# Patient Record
Sex: Male | Born: 1965
Health system: Southern US, Community
[De-identification: ages and names within clinical notes are randomized; demographics above are authoritative.]

## PROBLEM LIST (undated history)

## (undated) DIAGNOSIS — F419 Anxiety disorder, unspecified: Secondary | ICD-10-CM

## (undated) DIAGNOSIS — I10 Essential (primary) hypertension: Secondary | ICD-10-CM

## (undated) DIAGNOSIS — K219 Gastro-esophageal reflux disease without esophagitis: Secondary | ICD-10-CM

## (undated) DIAGNOSIS — R2231 Localized swelling, mass and lump, right upper limb: Secondary | ICD-10-CM

## (undated) HISTORY — PX: WISDOM TOOTH EXTRACTION: SHX21

---

## 2012-05-03 ENCOUNTER — Emergency Department (HOSPITAL_COMMUNITY): Payer: 59

## 2012-05-03 ENCOUNTER — Other Ambulatory Visit: Payer: Self-pay | Admitting: Family Medicine

## 2012-05-03 ENCOUNTER — Observation Stay (HOSPITAL_COMMUNITY)
Admission: EM | Admit: 2012-05-03 | Discharge: 2012-05-03 | Disposition: A | Discharge status: Home / Self Care | Payer: 59 | Attending: Emergency Medicine | Admitting: Emergency Medicine

## 2012-05-03 ENCOUNTER — Encounter (HOSPITAL_COMMUNITY): Payer: Self-pay

## 2012-05-03 ENCOUNTER — Observation Stay (HOSPITAL_COMMUNITY): Payer: 59

## 2012-05-03 DIAGNOSIS — I10 Essential (primary) hypertension: Secondary | ICD-10-CM

## 2012-05-03 DIAGNOSIS — G459 Transient cerebral ischemic attack, unspecified: Secondary | ICD-10-CM

## 2012-05-03 DIAGNOSIS — R110 Nausea: Secondary | ICD-10-CM | POA: Insufficient documentation

## 2012-05-03 DIAGNOSIS — E785 Hyperlipidemia, unspecified: Secondary | ICD-10-CM

## 2012-05-03 DIAGNOSIS — H538 Other visual disturbances: Secondary | ICD-10-CM

## 2012-05-03 DIAGNOSIS — Z79899 Other long term (current) drug therapy: Secondary | ICD-10-CM | POA: Insufficient documentation

## 2012-05-03 DIAGNOSIS — H532 Diplopia: Secondary | ICD-10-CM

## 2012-05-03 DIAGNOSIS — R42 Dizziness and giddiness: Secondary | ICD-10-CM | POA: Insufficient documentation

## 2012-05-03 HISTORY — DX: Essential (primary) hypertension: I10

## 2012-05-03 LAB — CBC
MCH: 29.7 pg (ref 26.0–34.0)
MCV: 81.7 fL (ref 78.0–100.0)
Platelets: 160 10*3/uL (ref 150–400)
RBC: 5.09 MIL/uL (ref 4.22–5.81)

## 2012-05-03 LAB — COMPREHENSIVE METABOLIC PANEL
ALT: 34 U/L (ref 0–53)
AST: 23 U/L (ref 0–37)
AST: 22 U/L (ref 0–37)
CO2: 23 mEq/L (ref 19–32)
CO2: 24 mEq/L (ref 19–32)
Calcium: 9.4 mg/dL (ref 8.4–10.5)
Calcium: 9 mg/dL (ref 8.4–10.5)
Chloride: 104 mEq/L (ref 96–112)
Creatinine, Ser: 1.05 mg/dL (ref 0.50–1.35)
GFR calc Af Amer: >90 mL/min (ref >90)
GFR calc Af Amer: >90 mL/min (ref >90)
GFR calc non Af Amer: 86 mL/min — ABNORMAL LOW (ref >90)
GFR calc non Af Amer: 85 mL/min — ABNORMAL LOW (ref >90)
Glucose, Bld: 99 mg/dL (ref 70–99)
Sodium: 140 mEq/L (ref 135–145)
Total Bilirubin: 0.9 mg/dL (ref 0.3–1.2)

## 2012-05-03 LAB — CBC WITH DIFFERENTIAL/PLATELET
Eosinophils Relative: 1 % (ref 0–5)
HCT: 42.9 % (ref 39.0–52.0)
Lymphocytes Relative: 30 % (ref 12–46)
Lymphs Abs: 1.7 10*3/uL (ref 0.7–4.0)
MCV: 80.8 fL (ref 78.0–100.0)
Monocytes Absolute: 0.3 10*3/uL (ref 0.1–1.0)
Neutro Abs: 3.6 10*3/uL (ref 1.7–7.7)
Platelets: 161 10*3/uL (ref 150–400)
RBC: 5.31 MIL/uL (ref 4.22–5.81)
WBC: 5.7 10*3/uL (ref 4.0–10.5)

## 2012-05-03 LAB — LIPID PANEL
HDL: 44 mg/dL (ref >39)
LDL Cholesterol: 144 mg/dL — ABNORMAL HIGH (ref 0–99)
Triglycerides: 102 mg/dL (ref <150)

## 2012-05-03 LAB — TROPONIN I: Troponin I: <0.3 ng/mL (ref <0.30)

## 2012-05-03 MED ORDER — SODIUM CHLORIDE 0.9 % IV BOLUS (SEPSIS)
1000.0000 mL | Freq: Once | INTRAVENOUS | Status: AC
  Administered 2012-05-03: 1000 mL via INTRAVENOUS

## 2012-05-03 MED ORDER — ASPIRIN 81 MG PO CHEW: 81.0000 mg | CHEWABLE_TABLET | Freq: Every day | ORAL | Status: DC

## 2012-05-03 MED ORDER — ASPIRIN 325 MG PO TABS
325.0000 mg | ORAL_TABLET | Freq: Every day | ORAL | Status: DC
  Administered 2012-05-03: 325 mg via ORAL
  Filled 2012-05-03: qty 1

## 2012-05-03 MED ORDER — ATORVASTATIN CALCIUM 10 MG PO TABS: 10.0000 mg | ORAL_TABLET | Freq: Every day | ORAL | Status: DC

## 2012-05-03 MED ORDER — ENOXAPARIN SODIUM 40 MG/0.4ML ~~LOC~~ SOLN
40.0000 mg | SUBCUTANEOUS | Status: DC
  Administered 2012-05-03: 40 mg via SUBCUTANEOUS
  Filled 2012-05-03: qty 0.4

## 2012-05-03 NOTE — Progress Notes (Signed)
VASCULAR LAB PRELIMINARY  PRELIMINARY  PRELIMINARY  PRELIMINARY  Carotid duplex completed.    Preliminary report:  Bilateral:  No evidence of hemodynamically significant internal carotid artery stenosis.   Vertebral artery flow is antegrade.     Karl Davidson, RVS 05/03/2012, 4:58 PM

## 2012-05-03 NOTE — ED Notes (Signed)
Patient transported to CT 

## 2012-05-03 NOTE — ED Notes (Signed)
Pt presents with sudden onset of blurred vision to both eyes with at work today at [employer].  Pt reports feeling tired and flushed during event, lasting 4-5 minutes.  Pt denies any pain.  Pt seen at his PCP after episode and referred here.  At present, pt reports his head feels "goofy" but reports vision is back to normal.

## 2012-05-03 NOTE — ED Provider Notes (Signed)
History     CSN: [id number]  Arrival date & time 05/03/12  1112   First MD Initiated Contact with Patient 05/03/12 1206      Chief Complaint  Patient presents with  . Blurred Vision    (Consider location/radiation/quality/duration/timing/severity/associated sxs/prior treatment) HPI Comments: Patient presents with episode of blurred vision in both eyes that lasted about 4 minutes and is now resolved. He was standing at work when this happened. With associated with nausea. He denies any dizziness but did have mild lightheadedness. No vertigo. States the vision appeared blurry as if he got up too fast he was already standing in the vision was 100 times worse than it is when he gets up too fast. He denies any chest pain or shortness of breath. He feels back to normal now. His vision is normal. Denies any focal weakness, numbness or tingling. No difficulty talking or swallowing.  The history is provided by the patient.    Past Medical History  Diagnosis Date  . Hypertension     History reviewed. No pertinent past surgical history.  No family history on file.  History  Substance Use Topics  . Smoking status: Never Smoker   . Smokeless tobacco: Not on file  . Alcohol Use: Yes      Review of Systems  Constitutional: Negative for fever, activity change and appetite change.  HENT: Negative for congestion and rhinorrhea.   Eyes: Positive for visual disturbance.  Respiratory: Negative for cough, chest tightness and shortness of breath.   Cardiovascular: Negative for chest pain.  Gastrointestinal: Negative for nausea, vomiting and abdominal pain.  Genitourinary: Negative for dysuria.  Neurological: Positive for dizziness. Negative for weakness, numbness and headaches.    Allergies  Review of patient's allergies indicates no known allergies.  Home Medications   Current Outpatient Rx  Name Route Sig Dispense Refill  . ESCITALOPRAM OXALATE 10 MG PO TABS Oral Take 10 mg by  mouth daily.      BP 144/92  Pulse 67  Temp 99.3 F (37.4 C) (Oral)  Resp 17  SpO2 99%  Physical Exam  Constitutional: He is oriented to person, place, and time. He appears well-developed and well-nourished. No distress.  HENT:  Head: Normocephalic and atraumatic.  Mouth/Throat: Oropharynx is clear and moist. No oropharyngeal exudate.  Eyes: Conjunctivae normal and EOM are normal. Pupils are equal, round, and reactive to light.  Neck: Normal range of motion. Neck supple.       No carotid bruit  Cardiovascular: Normal rate, regular rhythm and normal heart sounds.   No murmur heard. Pulmonary/Chest: Effort normal and breath sounds normal. No respiratory distress.  Abdominal: Soft. There is no tenderness. There is no rebound and no guarding.  Musculoskeletal: Normal range of motion. He exhibits no edema and no tenderness.  Neurological: He is alert and oriented to person, place, and time. No cranial nerve deficit.       CN 2-12 intact, 5/5 strength throughout, no ataxia on finger to nose, visual fields full to confrontation.  Skin: Skin is warm.    ED Course  Procedures (including critical care time)  Labs Reviewed  CBC WITH DIFFERENTIAL - Abnormal; Notable for the following:    MCHC 36.6 (*)     All other components within normal limits  COMPREHENSIVE METABOLIC PANEL - Abnormal; Notable for the following:    GFR calc non Af Amer 86 (*)     All other components within normal limits  PROTIME-INR  APTT  TROPONIN I  CBC  COMPREHENSIVE METABOLIC PANEL  URINALYSIS, ROUTINE W REFLEX MICROSCOPIC  LIPID PANEL  HEMOGLOBIN A1C   Ct Head Wo Contrast  05/03/2012  *RADIOLOGY REPORT*  Clinical Data: Blurred vision  CT HEAD WITHOUT CONTRAST  Technique:  Contiguous axial images were obtained from the base of the skull through the vertex without contrast.  Comparison: None.  Findings: No acute hemorrhage, acute infarction, or mass lesion is identified.  No midline shift.  No  ventriculomegaly.  No skull fracture.  Orbits and paranasal sinuses are intact.  IMPRESSION: Normal head CT.   Original Report Authenticated By: Harrel Lemon, M.D.      No diagnosis found.    MDM  Transient episode of blurred vision in both eyes. Now back to normal. No other neurological deficits. Not associated with vertigo or dizziness or lightheadedness. Do not happen on position change.  Concern for TIA. ABCD 2 score is 1. Patient is a tachycardia with standing. No hypotension. No further blurry vision, dizziness, lightheadedness. Given risk factors of smoking and hypertension we'll proceed with TIA workup. Continue IV hydration and oral hydration after passing swallow screen.   Date: 05/03/2012  Rate: 64  Rhythm: normal sinus rhythm  QRS Axis: normal  Intervals: normal  ST/T Wave abnormalities: nonspecific ST/T changes  Conduction Disutrbances:none  Narrative Interpretation: T wave inversion lead 3  Old EKG Reviewed: none available       Glynn Octave, MD 05/03/12 [phone]

## 2012-05-03 NOTE — Progress Notes (Signed)
  Echocardiogram 2D Echocardiogram has been performed.  Karl Davidson 05/03/2012, 5:02 PM

## 2012-05-03 NOTE — ED Notes (Signed)
Visual acuity was  20/20 OD. 20/20 OS 20/20 OU  Visual acuity was completed with corrective lenses due to pt states that his natural vision is "very bad".

## 2012-05-03 NOTE — ED Notes (Signed)
Prescriptions given with discharge instructions. Pt. Verbalized understanding. Alert and oriented x4.

## 2012-05-03 NOTE — ED Provider Notes (Signed)
Patient to move to CDU under observation for TIA protocol.  This is a shared visit with Dr Manus Gunning. Patient with hx HTN, smoking. Sudden onset bilateral blurry vision x 5 minutes.  Pt is also orthostatic.  Pt to come to CDU for IVF, TIA protocol.  ABCD2 score by my calculation is 1.  Discussed patient with Fayrene Helper, PA-C, who assumes care of patient upon arrival to CDU.     [place], [place] 05/03/12 [phone]

## 2012-05-03 NOTE — ED Provider Notes (Signed)
Medical screening examination/treatment/procedure(s) were conducted as a shared visit with non-physician practitioner(s) and myself.  I personally evaluated the patient during the encounter   Karl Octave, MD 05/03/12 1642

## 2012-05-03 NOTE — ED Provider Notes (Signed)
TIA protocol.  Risk factors include smoking, HTN.  Sudden onset of bilat blurry vision, and orthostatic.   Will give ASA. ABCD2 score of 1 (for BP of 159/115).    Patient reports he does not smoke.  Does have history of hypertension currently not on any medication, and a family history of stroke. His visual acuity is normal. He does have evidence of orthostasis as his heart rate fluctuating from 59 In a lying position, to 77 on standing position.  IVF given.  On examination he appeared in good health and spirits. Vital signs as documented. Skin warm and dry and without overt rashes. Neck without JVD. Lungs clear. Heart exam notable for regular rhythm, normal sounds and absence of murmurs, rubs or gallops. Abdomen unremarkable and without evidence of organomegaly, masses, or abdominal aortic enlargement. Extremities nonedematous. Neurologically, the patient was awake, alert, and oriented to person, place and time. There were no obvious focal neurologic abnormalities.  7:50 PM Brain MRI/MRA shows no acute abnormality.  Carotid duplex and 2d cardiac echo are unremarkable.  Pt does have a mildly elevated total cholesterol of 208 and LDL of 144.  Therefore, will prescribe a statin medication.  I also recommend pt to address his elevated BP with his PCP which he is scheduled to be seen on Monday.  Pt currently stable to be d/c.    BP 167/100  Pulse 57  Temp 98.6 F (37 C) (Oral)  Resp 20  Ht 5' 9.75" (1.772 m)  Wt 199 lb (90.266 kg)  BMI 28.76 kg/m2  SpO2 99%  Results for orders placed during the hospital encounter of 05/03/12  CBC WITH DIFFERENTIAL      Component Value Range   WBC 5.7  4.0 - 10.5 K/uL   RBC 5.31  4.22 - 5.81 MIL/uL   Hemoglobin 15.7  13.0 - 17.0 g/dL   HCT 56.4  33.2 - 95.1 %   MCV 80.8  78.0 - 100.0 fL   MCH 29.6  26.0 - 34.0 pg   MCHC 36.6 (*) 30.0 - 36.0 g/dL   RDW 88.4  16.6 - 06.3 %   Platelets 161  150 - 400 K/uL   Neutrophils Relative 63  43 - 77 %   Neutro Abs 3.6   1.7 - 7.7 K/uL   Lymphocytes Relative 30  12 - 46 %   Lymphs Abs 1.7  0.7 - 4.0 K/uL   Monocytes Relative 6  3 - 12 %   Monocytes Absolute 0.3  0.1 - 1.0 K/uL   Eosinophils Relative 1  0 - 5 %   Eosinophils Absolute 0.1  0.0 - 0.7 K/uL   Basophils Relative 0  0 - 1 %   Basophils Absolute 0.0  0.0 - 0.1 K/uL  COMPREHENSIVE METABOLIC PANEL      Component Value Range   Sodium 140  135 - 145 mEq/L   Potassium 3.5  3.5 - 5.1 mEq/L   Chloride 104  96 - 112 mEq/L   CO2 23  19 - 32 mEq/L   Glucose, Bld 99  70 - 99 mg/dL   BUN 12  6 - 23 mg/dL   Creatinine, Ser 0.16  0.50 - 1.35 mg/dL   Calcium 9.4  8.4 - 01.0 mg/dL   Total Protein 7.1  6.0 - 8.3 g/dL   Albumin 4.2  3.5 - 5.2 g/dL   AST 23  0 - 37 U/L   ALT 34  0 - 53 U/L   Alkaline Phosphatase  49  39 - 117 U/L   Total Bilirubin 0.9  0.3 - 1.2 mg/dL   GFR calc non Af Amer 86 (*) >90 mL/min   GFR calc Af Amer >90  >90 mL/min  PROTIME-INR      Component Value Range   Prothrombin Time 13.2  11.6 - 15.2 seconds   INR 1.01  0.00 - 1.49  APTT      Component Value Range   aPTT 29  24 - 37 seconds  TROPONIN I      Component Value Range   Troponin I <0.30  <0.30 ng/mL  CBC      Component Value Range   WBC 5.8  4.0 - 10.5 K/uL   RBC 5.09  4.22 - 5.81 MIL/uL   Hemoglobin 15.1  13.0 - 17.0 g/dL   HCT 16.1  09.6 - 04.5 %   MCV 81.7  78.0 - 100.0 fL   MCH 29.7  26.0 - 34.0 pg   MCHC 36.3 (*) 30.0 - 36.0 g/dL   RDW 40.9  81.1 - 91.4 %   Platelets 160  150 - 400 K/uL  COMPREHENSIVE METABOLIC PANEL      Component Value Range   Sodium 140  135 - 145 mEq/L   Potassium 4.1  3.5 - 5.1 mEq/L   Chloride 106  96 - 112 mEq/L   CO2 24  19 - 32 mEq/L   Glucose, Bld 86  70 - 99 mg/dL   BUN 11  6 - 23 mg/dL   Creatinine, Ser 7.82  0.50 - 1.35 mg/dL   Calcium 9.0  8.4 - 95.6 mg/dL   Total Protein 6.6  6.0 - 8.3 g/dL   Albumin 3.9  3.5 - 5.2 g/dL   AST 22  0 - 37 U/L   ALT 30  0 - 53 U/L   Alkaline Phosphatase 44  39 - 117 U/L   Total  Bilirubin 0.9  0.3 - 1.2 mg/dL   GFR calc non Af Amer 85 (*) >90 mL/min   GFR calc Af Amer >90  >90 mL/min  LIPID PANEL      Component Value Range   Cholesterol 208 (*) 0 - 200 mg/dL   Triglycerides 213  <086 mg/dL   HDL 44  >57 mg/dL   Total CHOL/HDL Ratio 4.7     VLDL 20  0 - 40 mg/dL   LDL Cholesterol 846 (*) 0 - 99 mg/dL   Ct Head Wo Contrast  05/03/2012  *RADIOLOGY REPORT*  Clinical Data: Blurred vision  CT HEAD WITHOUT CONTRAST  Technique:  Contiguous axial images were obtained from the base of the skull through the vertex without contrast.  Comparison: None.  Findings: No acute hemorrhage, acute infarction, or mass lesion is identified.  No midline shift.  No ventriculomegaly.  No skull fracture.  Orbits and paranasal sinuses are intact.  IMPRESSION: Normal head CT.   Original Report Authenticated By: Harrel Lemon, M.D.    Mr Brain Wo Contrast  05/03/2012  *RADIOLOGY REPORT*  Clinical Data:  Blurred vision.  Rule out CVA  MRI HEAD WITHOUT CONTRAST MRA HEAD WITHOUT CONTRAST  Technique:  Multiplanar, multiecho pulse sequences of the brain and surrounding structures were obtained without intravenous contrast. Angiographic images of the head were obtained using MRA technique without contrast.  Comparison:  CT head from today  MRI HEAD  Findings:  Negative for acute or chronic infarct.  Cerebral white matter is normal.  Negative for demyelinating disease.  Brainstem and cerebellum are normal.  Negative for hemorrhage or mass. Ventricle size is normal.  Pituitary is normal in size.  IMPRESSION: Normal exam  MRA HEAD  Findings: Both vertebral arteries are patent to the basilar.  The basilar is widely patent.  PICA, superior cerebellar, and posterior cerebral arteries are patent bilaterally.  Posterior communicating artery is patent bilaterally.  Internal carotid artery is patent bilaterally without stenosis. Anterior and middle cerebral arteries are patent bilaterally.  Negative for cerebral  aneurysm.  IMPRESSION: Negative   Original Report Authenticated By: Camelia Phenes, M.D.    Mr Mra Head/brain Wo Cm  05/03/2012  *RADIOLOGY REPORT*  Clinical Data:  Blurred vision.  Rule out CVA  MRI HEAD WITHOUT CONTRAST MRA HEAD WITHOUT CONTRAST  Technique:  Multiplanar, multiecho pulse sequences of the brain and surrounding structures were obtained without intravenous contrast. Angiographic images of the head were obtained using MRA technique without contrast.  Comparison:  CT head from today  MRI HEAD  Findings:  Negative for acute or chronic infarct.  Cerebral white matter is normal.  Negative for demyelinating disease.  Brainstem and cerebellum are normal.  Negative for hemorrhage or mass. Ventricle size is normal.  Pituitary is normal in size.  IMPRESSION: Normal exam  MRA HEAD  Findings: Both vertebral arteries are patent to the basilar.  The basilar is widely patent.  PICA, superior cerebellar, and posterior cerebral arteries are patent bilaterally.  Posterior communicating artery is patent bilaterally.  Internal carotid artery is patent bilaterally without stenosis. Anterior and middle cerebral arteries are patent bilaterally.  Negative for cerebral aneurysm.  IMPRESSION: Negative   Original Report Authenticated By: Camelia Phenes, M.D.         Author:  Kerrin Champagne  Service:  Vascular Lab  Author Type:  Cardiovascular Sonographer   Filed:  05/03/12 1658  Note Time:  05/03/12 1657          VASCULAR LAB  PRELIMINARY PRELIMINARY PRELIMINARY PRELIMINARY  Carotid duplex completed.  Preliminary report: Bilateral: No evidence of hemodynamically significant internal carotid artery stenosis. Vertebral artery flow is antegrade.  SLAUGHTER, [place], RVS  05/03/2012, 4:58 PM     Fayrene Helper, PA-C 05/03/12 2000

## 2012-05-03 NOTE — ED Notes (Signed)
Pt. Denies any blurred vision.

## 2012-05-03 NOTE — ED Notes (Signed)
Pt. is in Vascular Ultra sound and then will go to MRI.  Report given pt has not arrived to CDU

## 2012-05-04 NOTE — ED Provider Notes (Signed)
Medical screening examination/treatment/procedure(s) were conducted as a shared visit with non-physician practitioner(s) and myself.  I personally evaluated the patient during the encounter   Zohan Karlyn Glasco, MD 05/04/12 0904 

## 2013-04-03 ENCOUNTER — Ambulatory Visit
Admission: RE | Admit: 2013-04-03 | Discharge: 2013-04-03 | Disposition: A | Discharge status: Home / Self Care | Payer: BC Managed Care – PPO | Source: Ambulatory Visit | Attending: Dermatology | Admitting: Dermatology

## 2013-04-03 ENCOUNTER — Other Ambulatory Visit: Payer: Self-pay | Admitting: Dermatology

## 2013-04-03 DIAGNOSIS — D485 Neoplasm of uncertain behavior of skin: Secondary | ICD-10-CM

## 2013-04-11 ENCOUNTER — Telehealth (INDEPENDENT_AMBULATORY_CARE_PROVIDER_SITE_OTHER): Payer: Self-pay | Admitting: Surgery

## 2013-04-11 ENCOUNTER — Ambulatory Visit (INDEPENDENT_AMBULATORY_CARE_PROVIDER_SITE_OTHER): Payer: BC Managed Care – PPO | Admitting: Surgery

## 2013-04-11 ENCOUNTER — Encounter (INDEPENDENT_AMBULATORY_CARE_PROVIDER_SITE_OTHER): Payer: Self-pay | Admitting: Surgery

## 2013-04-11 VITALS — BP 138/80 | HR 60 | Temp 98.0°F | Resp 14 | Ht 69.0 in | Wt 200.0 lb

## 2013-04-11 DIAGNOSIS — R229 Localized swelling, mass and lump, unspecified: Secondary | ICD-10-CM

## 2013-04-11 DIAGNOSIS — R2231 Localized swelling, mass and lump, right upper limb: Secondary | ICD-10-CM | POA: Insufficient documentation

## 2013-04-11 NOTE — Progress Notes (Signed)
Re:   Karl Davidson DOB:   [birth year] MRN:   [id number]  ASSESSMENT AND PLAN: 1.  Knot, right forearm - 1.0 cm  I reviewed the findings.  This can be taken out under local anesthesia.  The ultrasound implies a "hypervascular" lesion.  I discussed the risks including, but not limited to, bleeding, infection, continued pain after the nodule is removed, and nerve injury.  I gave him literature on a lipoma, though I doubt it is a lipoma.  Will schedule this at a time convenient for her. 2.  Hypertension 3.  Anxiety  Chief Complaint  Patient presents with  . New Evaluation    eval lipoma/mass right upper forarm   REFERRING PHYSICIAN: Neldon Labella, MD  HISTORY OF PRESENT ILLNESS: Karl Davidson is a 47 y.o. (DOB: Jan 11, 1966)  white  male whose primary care physician is Neldon Labella, MD (he did see Dr. Teena Irani, but he has [facility]) and comes to me today for mass of the right arm.  He has noticed this knot for about 2 years.  It is a little better, but it has been tender from when he first noticed it.  He saw Dr. Adline Mango with dermatology.  She obtained an [place] on 04/03/2013 which showed a 8 x 8 mm hypervascular nodular lesion.  The patient is ready to have this removed.   Past Medical History  Diagnosis Date  . Hypertension      History reviewed. No pertinent past surgical history.    Current Outpatient Prescriptions  Medication Sig Dispense Refill  . amLODipine (NORVASC) 5 MG tablet Take 5 mg by mouth daily.      Marland Kitchen escitalopram (LEXAPRO) 10 MG tablet Take 10 mg by mouth daily.       No current facility-administered medications for this visit.     No Known Allergies  REVIEW OF SYSTEMS: Skin:  No history of rash.  No history of abnormal moles. Infection:  No history of hepatitis or HIV.  No history of MRSA. Neurologic:  Questionable TIA - 2013.  He said that his vision "fluttered" for about 15 minutes.  He spent a day in the ER, but no specific diagnosis  was found. Cardiac:  Hypertension. No history of heart disease.  No history of prior cardiac catheterization.  No history of seeing a cardiologist. Pulmonary:  Does not smoke cigarettes.  No asthma or bronchitis.  No OSA/CPAP.  Endocrine:  No diabetes. No thyroid disease. Gastrointestinal:  No history of stomach disease.  No history of liver disease.  No history of gall bladder disease.  No history of pancreas disease.  No history of colon disease. Urologic:  No history of kidney stones.  No history of bladder infections. Musculoskeletal:  No history of joint or back disease. Hematologic:  No bleeding disorder.  No history of anemia.  Not anticoagulated. Psycho-social:  The patient is oriented.   History of anxiety.  SOCIAL and FAMILY HISTORY: Unmarried, but has girlfriend. No children. Works for [employer].  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  PHYSICAL EXAM: BP 138/80  Pulse 60  Temp(Src) 98 F (36.7 C) (Temporal)  Resp 14  Ht 5\' 9"  (1.753 m)  Wt 200 lb (90.719 kg)  BMI 29.52 kg/m2  General: WN wM who is alert and generally healthy appearing.  HEENT: Normal. Pupils equal. Extremities:  Good strength and ROM  in upper and lower extremities.  1.0 cm nodule upper outer right forearm.  Not attached to the skin.  Is tender.  Does not feel like a lipoma. Neurologic:  Grossly intact to motor and sensory function. Psychiatric: Has normal mood and affect. Behavior is normal.   DATA REVIEWED: Epic notes  Ovidio Kin, MD,  [facility] [address] [place]  [address]   [place], [place]    [address] Phone:  [phone] FAX:  [phone]

## 2013-04-11 NOTE — Telephone Encounter (Signed)
Met with surgery scheduling, went over financial  responsibility, placed in pending file

## 2014-08-01 IMAGING — US US EXTREM UP *R* LTD
1 series · 13 of 13 positions shown · non-contrast
Comparison: None.

CLINICAL DATA: Nodular lesion in right forearm for 2 years,
increasing in size

EXAM:
RIGHT UPPER EXTREMITY SOFT TISSUE ULTRASOUND LIMITED
TECHNIQUE: Ultrasound examination was performed including evaluation of the
muscles, tendons, joint, and adjacent soft tissues.

[Series 1: us extrem up *right* ltd · 0.10mm/px · 13 of 13 slices shown]
[im 1/13]
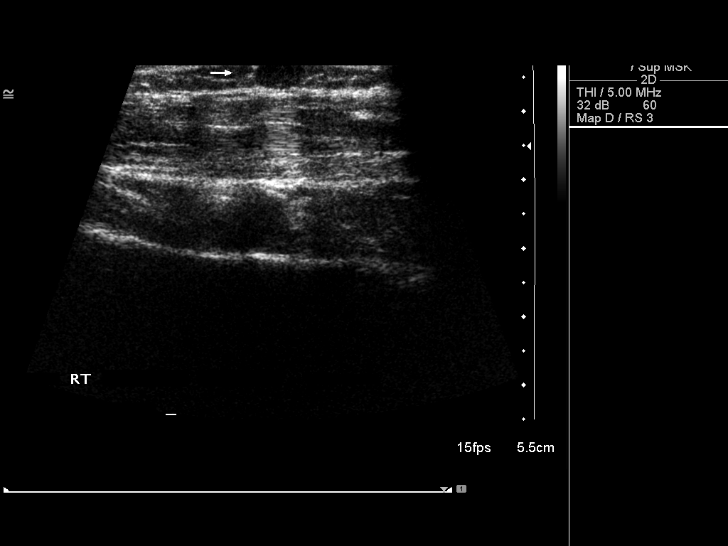
[im 2/13]
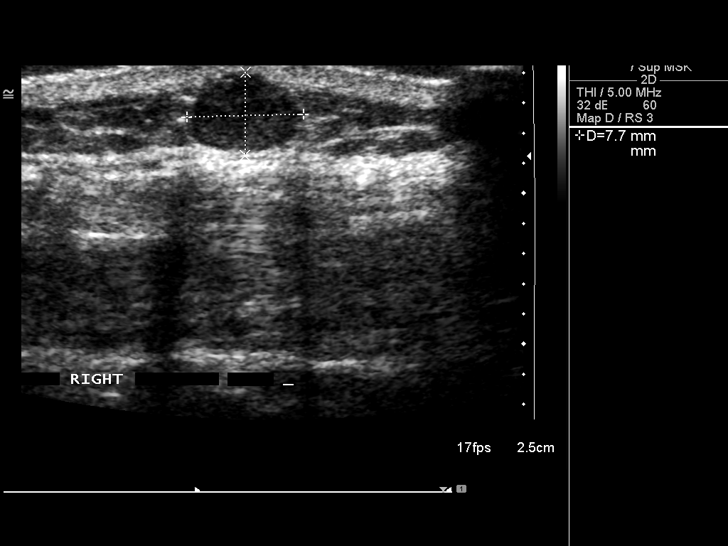
[im 3/13]
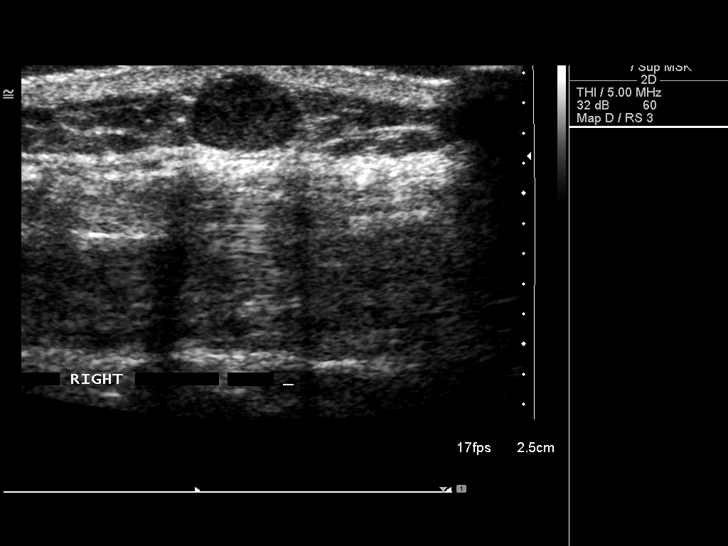
[im 4/13]
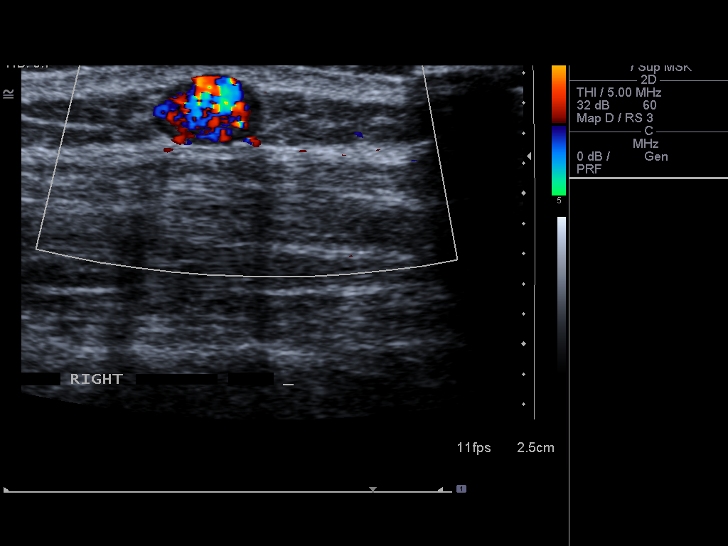
[im 5/13]
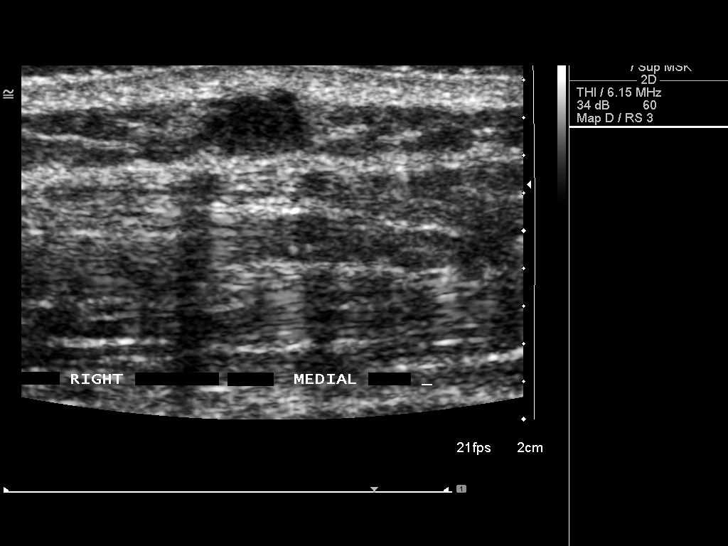
[im 6/13]
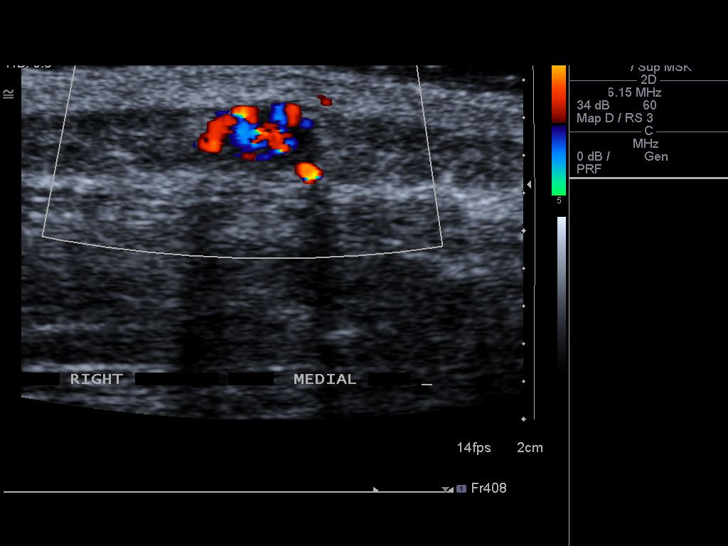
[im 7/13]
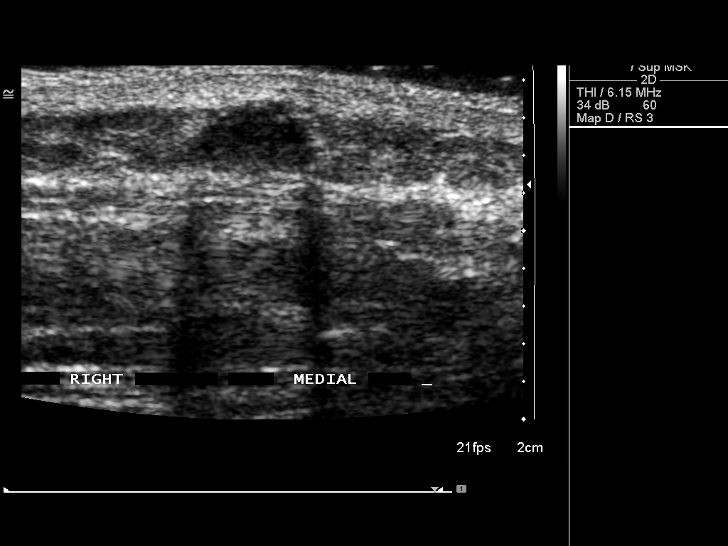
[im 8/13]
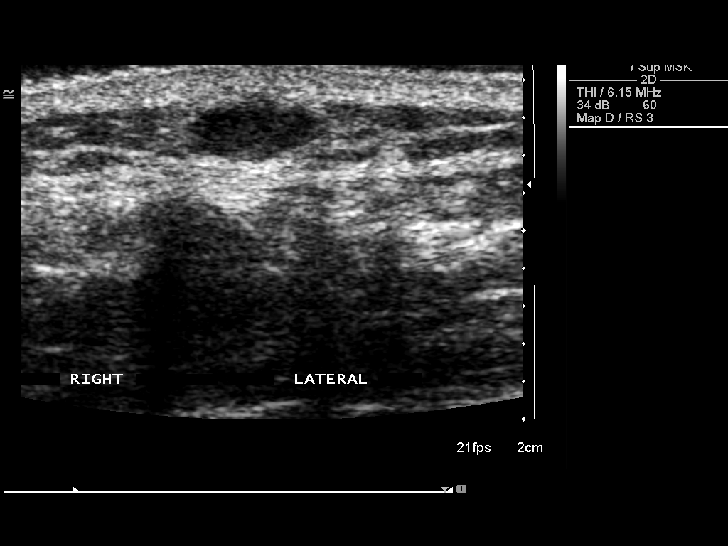
[im 9/13]
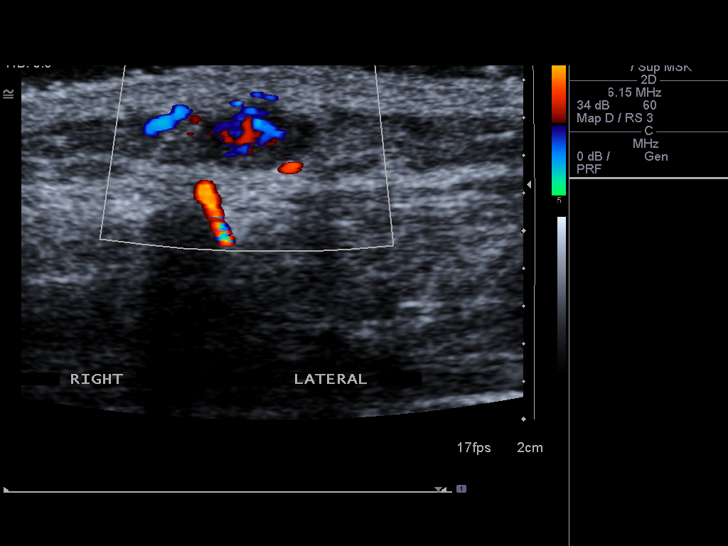
[im 10/13]
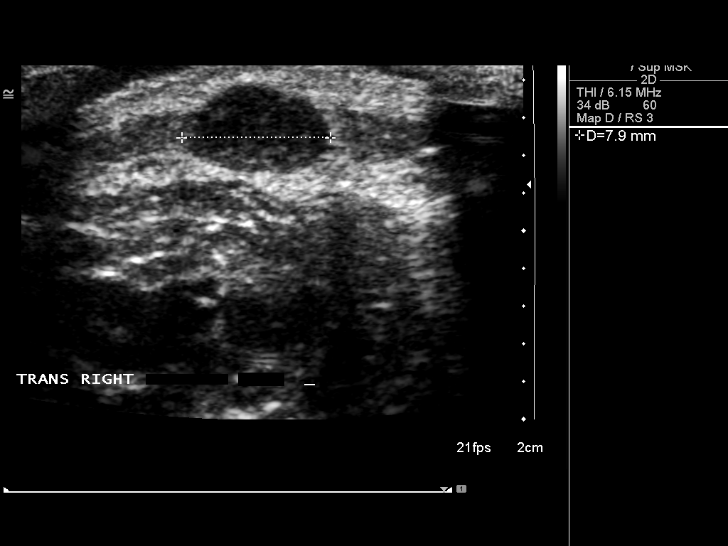
[im 11/13]
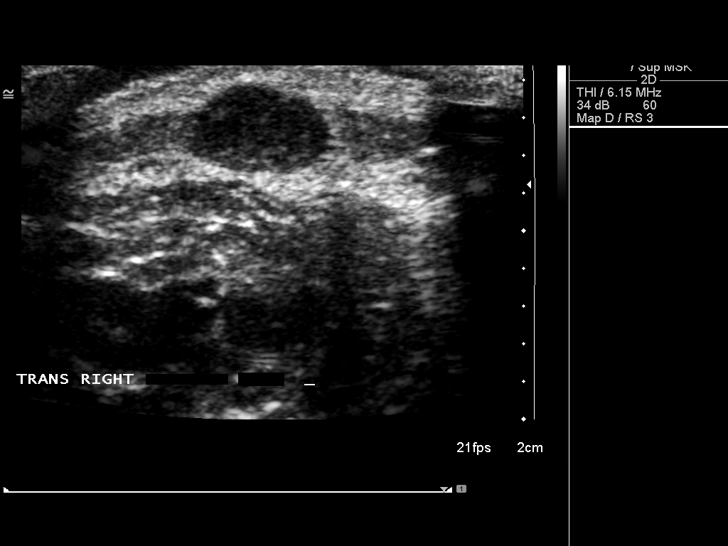
[im 12/13]
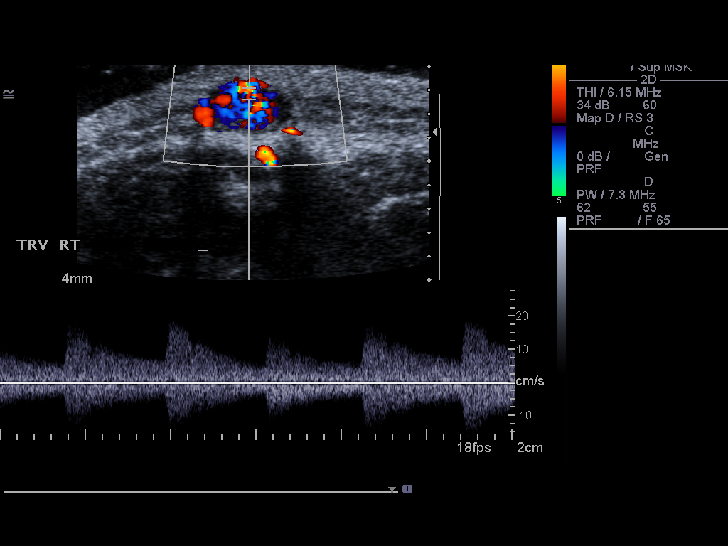
[im 13/13]
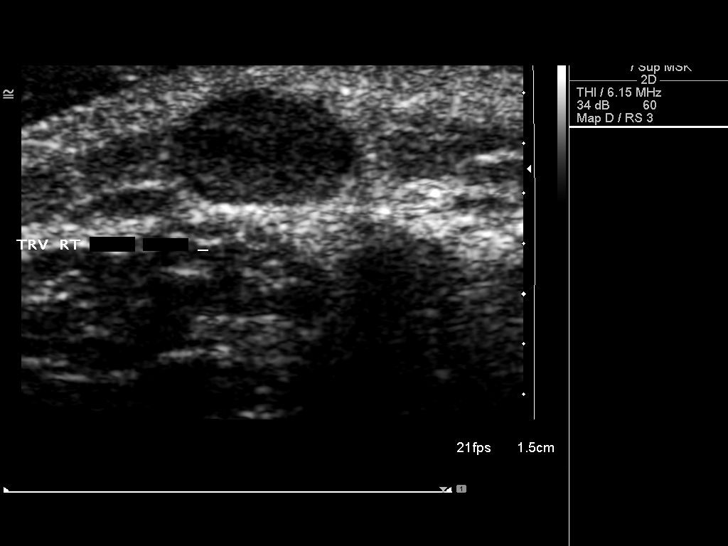

[13 of 13 positions shown; findings below may reference images not displayed]

FINDINGS: Ultrasound over the area in question was performed. There is a
superficial oval soft tissue mass present within the subcutaneous
soft tissues measuring 8 x 6 x 8 mm. This lesion does appear to be
hypervascular, and a hemangioma is a consideration. Continued
followup or MRI is recommended to assess further.
IMPRESSION: Hypervascular nodular superficial lesion in the right forearm may
represent a hemangioma of 8 mm in diameter, but consider either
close followup or MRI to assess further.

## 2015-12-22 DIAGNOSIS — E7800 Pure hypercholesterolemia, unspecified: Secondary | ICD-10-CM | POA: Diagnosis not present

## 2015-12-22 DIAGNOSIS — N529 Male erectile dysfunction, unspecified: Secondary | ICD-10-CM | POA: Diagnosis not present

## 2015-12-22 DIAGNOSIS — I10 Essential (primary) hypertension: Secondary | ICD-10-CM | POA: Diagnosis not present

## 2015-12-22 DIAGNOSIS — F411 Generalized anxiety disorder: Secondary | ICD-10-CM | POA: Diagnosis not present

## 2017-07-16 DIAGNOSIS — E669 Obesity, unspecified: Secondary | ICD-10-CM | POA: Diagnosis not present

## 2017-07-16 DIAGNOSIS — F411 Generalized anxiety disorder: Secondary | ICD-10-CM | POA: Diagnosis not present

## 2017-07-16 DIAGNOSIS — D1721 Benign lipomatous neoplasm of skin and subcutaneous tissue of right arm: Secondary | ICD-10-CM | POA: Diagnosis not present

## 2017-07-16 DIAGNOSIS — I10 Essential (primary) hypertension: Secondary | ICD-10-CM | POA: Diagnosis not present

## 2017-07-30 ENCOUNTER — Ambulatory Visit: Payer: Self-pay | Admitting: Surgery

## 2017-07-30 DIAGNOSIS — R2231 Localized swelling, mass and lump, right upper limb: Secondary | ICD-10-CM | POA: Diagnosis not present

## 2017-07-30 NOTE — H&P (Signed)
History of Present Illness Karl Davidson. Cumi Sanagustin MD; 07/30/2017 12:15 PM) The patient is a 52 year old male who presents with a complaint of Mass. Referred by Dr. Kathyrn Lass for right forearm Mass. This is a 52 year old male who presents with a 5 year history of a palpable mass in the lateral right forearm. He was evaluated by Dr. Alphonsa Overall in 2014. At that time this mass measured less than 1 cm. This was confirmed on ultrasound. The patient decided not to have this area removed. He has become noticeably larger and is causing severe tenderness. The patient has trouble making through the entire day at work because of the pain in his forearm. When this area is very tender there is some pain shooting down towards his fourth and fifth fingers. He denies any infections area. There has not been any further imaging of this area. 2014 he had an ultrasound of this area  CLINICAL DATA: Nodular lesion in right forearm for 2 years, increasing in size  EXAM: RIGHT UPPER EXTREMITY SOFT TISSUE ULTRASOUND LIMITED  TECHNIQUE: Ultrasound examination was performed including evaluation of the muscles, tendons, joint, and adjacent soft tissues.  COMPARISON: None.  FINDINGS: Ultrasound over the area in question was performed. There is a superficial oval soft tissue mass present within the subcutaneous soft tissues measuring 8 x 6 x 8 mm. This lesion does appear to be hypervascular, and a hemangioma is a consideration. Continued followup or MRI is recommended to assess further.  IMPRESSION: Hypervascular nodular superficial lesion in the right forearm may represent a hemangioma of 8 mm in diameter, but consider either close followup or MRI to assess further.   Electronically Signed By: Ivar Drape M.D. On: 04/03/2013 13:11   Past Surgical History (Tanisha A. Owens Shark, [place]; 07/30/2017 10:51 AM) Oral Surgery  Diagnostic Studies History (Tanisha A. Owens Shark, [place]; 07/30/2017 10:51 AM) Colonoscopy  never  Allergies (Tanisha A. Owens Shark, [place]; 07/30/2017 10:52 AM) No Known Drug Allergies [07/30/2017]: Allergies Reconciled  Medication History (Tanisha A. Owens Shark, [place]; 07/30/2017 10:53 AM) Lexapro (5MG  Tablet, Oral) Active. Losartan Potassium (50MG  Tablet, Oral) Active. AmLODIPine Besylate (10MG  Tablet, Oral) Active. Medications Reconciled  Social History (Tanisha A. Owens Shark, RMA; 07/30/2017 10:51 AM) Alcohol use Moderate alcohol use. Caffeine use Carbonated beverages, Tea. Illicit drug use Remotely quit drug use. Tobacco use Never smoker.  Family History (Tanisha A. Owens Shark, [place]; 07/30/2017 10:51 AM) Heart Disease Mother. Hypertension Mother.  Other Problems (Tanisha A. Owens Shark, [place]; 07/30/2017 10:51 AM) Anxiety Disorder High blood pressure Kidney Stone     Review of Systems (Tanisha A. Brown RMA; 07/30/2017 10:51 AM) General Not Present- Appetite Loss, Chills, Fatigue, Fever, Night Sweats, Weight Gain and Weight Loss. Skin Not Present- Change in Wart/Mole, Dryness, Hives, Jaundice, New Lesions, Non-Healing Wounds, Rash and Ulcer. HEENT Present- Wears glasses/contact lenses. Not Present- Earache, Hearing Loss, Hoarseness, Nose Bleed, Oral Ulcers, Ringing in the Ears, Seasonal Allergies, Sinus Pain, Sore Throat, Visual Disturbances and Yellow Eyes. Respiratory Not Present- Bloody sputum, Chronic Cough, Difficulty Breathing, Snoring and Wheezing. Breast Not Present- Breast Mass, Breast Pain, Nipple Discharge and Skin Changes. Cardiovascular Present- Swelling of Extremities. Not Present- Chest Pain, Difficulty Breathing Lying Down, Leg Cramps, Palpitations, Rapid Heart Rate and Shortness of Breath. Gastrointestinal Not Present- Abdominal Pain, Bloating, Bloody Stool, Change in Bowel Habits, Chronic diarrhea, Constipation, Difficulty Swallowing, Excessive gas, Gets full quickly at meals, Hemorrhoids, Indigestion, Nausea, Rectal Pain and Vomiting. Male Genitourinary Not Present-  Blood in Urine, Change in Urinary Stream, Frequency, Impotence, Nocturia, Painful Urination,  Urgency and Urine Leakage. Musculoskeletal Present- Muscle Pain. Not Present- Back Pain, Joint Pain, Joint Stiffness, Muscle Weakness and Swelling of Extremities. Neurological Present- Numbness, Tingling and Weakness. Not Present- Decreased Memory, Fainting, Headaches, Seizures, Tremor and Trouble walking. Psychiatric Present- Anxiety. Not Present- Bipolar, Change in Sleep Pattern, Depression, Fearful and Frequent crying. Endocrine Not Present- Cold Intolerance, Excessive Hunger, Hair Changes, Heat Intolerance, Hot flashes and New Diabetes. Hematology Not Present- Blood Thinners, Easy Bruising, Excessive bleeding, Gland problems, HIV and Persistent Infections.  Vitals (Tanisha A. Brown RMA; 07/30/2017 10:52 AM) 07/30/2017 10:52 AM Weight: 218 lb Height: 69in Body Surface Area: 2.14 m Body Mass Index: 32.19 kg/m  Temp.: 98.70F  Pulse: 63 (Regular)  BP: 126/82 (Sitting, Left Arm, Standard)      Physical Exam Rodman Key K. Towanda Hornstein MD; 07/30/2017 12:16 PM)  The physical exam findings are as follows: Note:WDWN in NAD Right lateral forearm - 2.2 cm mass - firm, tender, slight purplish color, not mobile; no sign of infection; Does not appear to sit on top of the ulnar nerve No numbness or tingling of 4th or 5th fingers with compression of this area.    Assessment & Plan Karl Davidson. Karl Karge MD; 07/30/2017 12:16 PM)  FOREARM MASS, RIGHT (R22.31) Impression: Possible hemangioma - possibly subfascial  2 cm, firm, tender - radiation down towards fingers  Current Plans Schedule for Surgery - Excision of mass - right forearm. The surgical procedure has been discussed with the patient. Potential risks, benefits, alternative treatments, and expected outcomes have been explained. All of the patient's questions at this time have been answered. The likelihood of reaching the patient's treatment  goal is good. The patient understand the proposed surgical procedure and wishes to proceed.  Karl Davidson. Karl Dover, MD, [facility]  General/ Trauma Surgery  07/30/2017 12:16 PM

## 2017-07-30 NOTE — H&P (View-Only) (Signed)
History of Present Illness Karl Davidson. Karl Kasa MD; 07/30/2017 12:15 PM) The patient is a 52 year old male who presents with a complaint of Mass. Referred by Dr. Kathyrn Lass for right forearm Mass. This is a 52 year old male who presents with a 5 year history of a palpable mass in the lateral right forearm. He was evaluated by Dr. Alphonsa Overall in 2014. At that time this mass measured less than 1 cm. This was confirmed on ultrasound. The patient decided not to have this area removed. He has become noticeably larger and is causing severe tenderness. The patient has trouble making through the entire day at work because of the pain in his forearm. When this area is very tender there is some pain shooting down towards his fourth and fifth fingers. He denies any infections area. There has not been any further imaging of this area. 2014 he had an ultrasound of this area  CLINICAL DATA: Nodular lesion in right forearm for 2 years, increasing in size  EXAM: RIGHT UPPER EXTREMITY SOFT TISSUE ULTRASOUND LIMITED  TECHNIQUE: Ultrasound examination was performed including evaluation of the muscles, tendons, joint, and adjacent soft tissues.  COMPARISON: None.  FINDINGS: Ultrasound over the area in question was performed. There is a superficial oval soft tissue mass present within the subcutaneous soft tissues measuring 8 x 6 x 8 mm. This lesion does appear to be hypervascular, and a hemangioma is a consideration. Continued followup or MRI is recommended to assess further.  IMPRESSION: Hypervascular nodular superficial lesion in the right forearm may represent a hemangioma of 8 mm in diameter, but consider either close followup or MRI to assess further.   Electronically Signed By: Ivar Drape M.D. On: 04/03/2013 13:11   Past Surgical History (Karl A. Owens Shark, [place]; 07/30/2017 10:51 AM) Oral Surgery  Diagnostic Studies History (Karl A. Owens Shark, [place]; 07/30/2017 10:51 AM) Colonoscopy  never  Allergies (Karl A. Owens Shark, [place]; 07/30/2017 10:52 AM) No Known Drug Allergies [07/30/2017]: Allergies Reconciled  Medication History (Karl A. Owens Shark, [place]; 07/30/2017 10:53 AM) Lexapro (5MG  Tablet, Oral) Active. Losartan Potassium (50MG  Tablet, Oral) Active. AmLODIPine Besylate (10MG  Tablet, Oral) Active. Medications Reconciled  Social History (Karl A. Owens Shark, RMA; 07/30/2017 10:51 AM) Alcohol use Moderate alcohol use. Caffeine use Carbonated beverages, Tea. Illicit drug use Remotely quit drug use. Tobacco use Never smoker.  Family History (Karl A. Owens Shark, [place]; 07/30/2017 10:51 AM) Heart Disease Mother. Hypertension Mother.  Other Problems (Karl A. Owens Shark, [place]; 07/30/2017 10:51 AM) Anxiety Disorder High blood pressure Kidney Stone     Review of Systems (Karl Davidson RMA; 07/30/2017 10:51 AM) General Not Present- Appetite Loss, Chills, Fatigue, Fever, Night Sweats, Weight Gain and Weight Loss. Skin Not Present- Change in Wart/Mole, Dryness, Hives, Jaundice, New Lesions, Non-Healing Wounds, Rash and Ulcer. HEENT Present- Wears glasses/contact lenses. Not Present- Earache, Hearing Loss, Hoarseness, Nose Bleed, Oral Ulcers, Ringing in the Ears, Seasonal Allergies, Sinus Pain, Sore Throat, Visual Disturbances and Yellow Eyes. Respiratory Not Present- Bloody sputum, Chronic Cough, Difficulty Breathing, Snoring and Wheezing. Breast Not Present- Breast Mass, Breast Pain, Nipple Discharge and Skin Changes. Cardiovascular Present- Swelling of Extremities. Not Present- Chest Pain, Difficulty Breathing Lying Down, Leg Cramps, Palpitations, Rapid Heart Rate and Shortness of Breath. Gastrointestinal Not Present- Abdominal Pain, Bloating, Bloody Stool, Change in Bowel Habits, Chronic diarrhea, Constipation, Difficulty Swallowing, Excessive gas, Gets full quickly at meals, Hemorrhoids, Indigestion, Nausea, Rectal Pain and Vomiting. Male Genitourinary Not Present-  Blood in Urine, Change in Urinary Stream, Frequency, Impotence, Nocturia, Painful Urination,  Urgency and Urine Leakage. Musculoskeletal Present- Muscle Pain. Not Present- Back Pain, Joint Pain, Joint Stiffness, Muscle Weakness and Swelling of Extremities. Neurological Present- Numbness, Tingling and Weakness. Not Present- Decreased Memory, Fainting, Headaches, Seizures, Tremor and Trouble walking. Psychiatric Present- Anxiety. Not Present- Bipolar, Change in Sleep Pattern, Depression, Fearful and Frequent crying. Endocrine Not Present- Cold Intolerance, Excessive Hunger, Hair Changes, Heat Intolerance, Hot flashes and New Diabetes. Hematology Not Present- Blood Thinners, Easy Bruising, Excessive bleeding, Gland problems, HIV and Persistent Infections.  Vitals (Karl Davidson RMA; 07/30/2017 10:52 AM) 07/30/2017 10:52 AM Weight: 218 lb Height: 69in Body Surface Area: 2.14 m Body Mass Index: 32.19 kg/m  Temp.: 98.23F  Pulse: 63 (Regular)  BP: 126/82 (Sitting, Left Arm, Standard)      Physical Exam Karl Key K. Robbyn Hodkinson MD; 07/30/2017 12:16 PM)  The physical exam findings are as follows: Note:WDWN in NAD Right lateral forearm - 2.2 cm mass - firm, tender, slight purplish color, not mobile; no sign of infection; Does not appear to sit on top of the ulnar nerve No numbness or tingling of 4th or 5th fingers with compression of this area.    Assessment & Plan Karl Davidson. Karl Advani MD; 07/30/2017 12:16 PM)  FOREARM MASS, RIGHT (R22.31) Impression: Possible hemangioma - possibly subfascial  2 cm, firm, tender - radiation down towards fingers  Current Plans Schedule for Surgery - Excision of mass - right forearm. The surgical procedure has been discussed with the patient. Potential risks, benefits, alternative treatments, and expected outcomes have been explained. All of the patient's questions at this time have been answered. The likelihood of reaching the patient's treatment  goal is good. The patient understand the proposed surgical procedure and wishes to proceed.  Karl Davidson. Karl Dover, MD, [facility]  General/ Trauma Surgery  07/30/2017 12:16 PM

## 2017-08-15 ENCOUNTER — Encounter (HOSPITAL_BASED_OUTPATIENT_CLINIC_OR_DEPARTMENT_OTHER): Payer: Self-pay | Admitting: *Deleted

## 2017-08-15 ENCOUNTER — Other Ambulatory Visit: Payer: Self-pay

## 2017-08-17 DIAGNOSIS — I10 Essential (primary) hypertension: Secondary | ICD-10-CM | POA: Diagnosis not present

## 2017-08-17 DIAGNOSIS — F411 Generalized anxiety disorder: Secondary | ICD-10-CM | POA: Diagnosis not present

## 2017-08-17 DIAGNOSIS — E669 Obesity, unspecified: Secondary | ICD-10-CM | POA: Diagnosis not present

## 2017-08-20 ENCOUNTER — Encounter (HOSPITAL_BASED_OUTPATIENT_CLINIC_OR_DEPARTMENT_OTHER)
Admission: RE | Admit: 2017-08-20 | Discharge: 2017-08-20 | Disposition: A | Discharge status: Home / Self Care | Payer: BLUE CROSS/BLUE SHIELD | Source: Ambulatory Visit | Attending: Surgery | Admitting: Surgery

## 2017-08-20 DIAGNOSIS — R2231 Localized swelling, mass and lump, right upper limb: Secondary | ICD-10-CM | POA: Diagnosis present

## 2017-08-20 DIAGNOSIS — F419 Anxiety disorder, unspecified: Secondary | ICD-10-CM | POA: Diagnosis not present

## 2017-08-20 DIAGNOSIS — Z8249 Family history of ischemic heart disease and other diseases of the circulatory system: Secondary | ICD-10-CM | POA: Diagnosis not present

## 2017-08-20 DIAGNOSIS — N200 Calculus of kidney: Secondary | ICD-10-CM | POA: Diagnosis not present

## 2017-08-20 DIAGNOSIS — Z79899 Other long term (current) drug therapy: Secondary | ICD-10-CM | POA: Diagnosis not present

## 2017-08-20 DIAGNOSIS — I10 Essential (primary) hypertension: Secondary | ICD-10-CM | POA: Diagnosis not present

## 2017-08-20 DIAGNOSIS — D1801 Hemangioma of skin and subcutaneous tissue: Secondary | ICD-10-CM | POA: Diagnosis not present

## 2017-08-22 ENCOUNTER — Ambulatory Visit (HOSPITAL_BASED_OUTPATIENT_CLINIC_OR_DEPARTMENT_OTHER)
Admission: RE | Admit: 2017-08-22 | Discharge: 2017-08-22 | Disposition: A | Discharge status: Home / Self Care | Payer: BLUE CROSS/BLUE SHIELD | Source: Ambulatory Visit | Attending: Surgery | Admitting: Surgery

## 2017-08-22 ENCOUNTER — Ambulatory Visit (HOSPITAL_BASED_OUTPATIENT_CLINIC_OR_DEPARTMENT_OTHER): Payer: BLUE CROSS/BLUE SHIELD | Admitting: Anesthesiology

## 2017-08-22 ENCOUNTER — Encounter (HOSPITAL_BASED_OUTPATIENT_CLINIC_OR_DEPARTMENT_OTHER)
Admission: RE | Disposition: A | Discharge status: Home / Self Care | Payer: Self-pay | Source: Ambulatory Visit | Attending: Surgery

## 2017-08-22 ENCOUNTER — Other Ambulatory Visit: Payer: Self-pay

## 2017-08-22 ENCOUNTER — Encounter (HOSPITAL_BASED_OUTPATIENT_CLINIC_OR_DEPARTMENT_OTHER): Payer: Self-pay | Admitting: Anesthesiology

## 2017-08-22 DIAGNOSIS — F419 Anxiety disorder, unspecified: Secondary | ICD-10-CM | POA: Insufficient documentation

## 2017-08-22 DIAGNOSIS — N200 Calculus of kidney: Secondary | ICD-10-CM | POA: Diagnosis not present

## 2017-08-22 DIAGNOSIS — Z79899 Other long term (current) drug therapy: Secondary | ICD-10-CM | POA: Diagnosis not present

## 2017-08-22 DIAGNOSIS — I10 Essential (primary) hypertension: Secondary | ICD-10-CM | POA: Insufficient documentation

## 2017-08-22 DIAGNOSIS — R2231 Localized swelling, mass and lump, right upper limb: Secondary | ICD-10-CM | POA: Diagnosis not present

## 2017-08-22 DIAGNOSIS — Z8249 Family history of ischemic heart disease and other diseases of the circulatory system: Secondary | ICD-10-CM | POA: Diagnosis not present

## 2017-08-22 DIAGNOSIS — D1801 Hemangioma of skin and subcutaneous tissue: Secondary | ICD-10-CM | POA: Diagnosis not present

## 2017-08-22 DIAGNOSIS — D1809 Hemangioma of other sites: Secondary | ICD-10-CM | POA: Diagnosis not present

## 2017-08-22 HISTORY — PX: MASS EXCISION: SHX2000

## 2017-08-22 HISTORY — DX: Localized swelling, mass and lump, right upper limb: R22.31

## 2017-08-22 HISTORY — DX: Gastro-esophageal reflux disease without esophagitis: K21.9

## 2017-08-22 HISTORY — DX: Anxiety disorder, unspecified: F41.9

## 2017-08-22 SURGERY — EXCISION MASS
Anesthesia: General | Site: Arm Lower | Laterality: Right

## 2017-08-22 MED ORDER — SCOPOLAMINE 1 MG/3DAYS TD PT72: 1.0000 | MEDICATED_PATCH | Freq: Once | TRANSDERMAL | Status: DC | PRN

## 2017-08-22 MED ORDER — BUPIVACAINE-EPINEPHRINE (PF) 0.25% -1:200000 IJ SOLN
INTRAMUSCULAR | Status: AC
  Filled 2017-08-22: qty 30

## 2017-08-22 MED ORDER — LIDOCAINE HCL (CARDIAC) 20 MG/ML IV SOLN
INTRAVENOUS | Status: DC | PRN
  Administered 2017-08-22: 30 mg via INTRAVENOUS

## 2017-08-22 MED ORDER — CHLORHEXIDINE GLUCONATE CLOTH 2 % EX PADS: 6.0000 | MEDICATED_PAD | Freq: Once | CUTANEOUS | Status: DC

## 2017-08-22 MED ORDER — ONDANSETRON HCL 4 MG/2ML IJ SOLN
INTRAMUSCULAR | Status: AC
  Filled 2017-08-22: qty 2

## 2017-08-22 MED ORDER — FENTANYL CITRATE (PF) 100 MCG/2ML IJ SOLN
INTRAMUSCULAR | Status: AC
  Filled 2017-08-22: qty 2

## 2017-08-22 MED ORDER — BUPIVACAINE-EPINEPHRINE 0.25% -1:200000 IJ SOLN
INTRAMUSCULAR | Status: DC | PRN
  Administered 2017-08-22: 5 mL

## 2017-08-22 MED ORDER — LIDOCAINE 2% (20 MG/ML) 5 ML SYRINGE
INTRAMUSCULAR | Status: AC
  Filled 2017-08-22: qty 5

## 2017-08-22 MED ORDER — FENTANYL CITRATE (PF) 100 MCG/2ML IJ SOLN: 25.0000 ug | INTRAMUSCULAR | Status: DC | PRN

## 2017-08-22 MED ORDER — PROPOFOL 10 MG/ML IV BOLUS
INTRAVENOUS | Status: DC | PRN
  Administered 2017-08-22: 200 mg via INTRAVENOUS

## 2017-08-22 MED ORDER — PROMETHAZINE HCL 25 MG/ML IJ SOLN: 6.2500 mg | INTRAMUSCULAR | Status: DC | PRN

## 2017-08-22 MED ORDER — MIDAZOLAM HCL 2 MG/2ML IJ SOLN
INTRAMUSCULAR | Status: AC
  Filled 2017-08-22: qty 2

## 2017-08-22 MED ORDER — GLYCOPYRROLATE 0.2 MG/ML IJ SOLN
INTRAMUSCULAR | Status: DC | PRN
  Administered 2017-08-22: 0.1 mg via INTRAVENOUS

## 2017-08-22 MED ORDER — MEPERIDINE HCL 25 MG/ML IJ SOLN: 6.2500 mg | INTRAMUSCULAR | Status: DC | PRN

## 2017-08-22 MED ORDER — FENTANYL CITRATE (PF) 100 MCG/2ML IJ SOLN: 50.0000 ug | INTRAMUSCULAR | Status: DC | PRN

## 2017-08-22 MED ORDER — MIDAZOLAM HCL 2 MG/2ML IJ SOLN: 1.0000 mg | INTRAMUSCULAR | Status: DC | PRN

## 2017-08-22 MED ORDER — DEXAMETHASONE SODIUM PHOSPHATE 10 MG/ML IJ SOLN
INTRAMUSCULAR | Status: AC
  Filled 2017-08-22: qty 1

## 2017-08-22 MED ORDER — FENTANYL CITRATE (PF) 100 MCG/2ML IJ SOLN
INTRAMUSCULAR | Status: DC | PRN
  Administered 2017-08-22: 100 ug via INTRAVENOUS

## 2017-08-22 MED ORDER — KETOROLAC TROMETHAMINE 30 MG/ML IJ SOLN: 30.0000 mg | Freq: Once | INTRAMUSCULAR | Status: DC | PRN

## 2017-08-22 MED ORDER — LACTATED RINGERS IV SOLN
INTRAVENOUS | Status: DC
  Administered 2017-08-22 (×2): via INTRAVENOUS

## 2017-08-22 MED ORDER — ONDANSETRON HCL 4 MG/2ML IJ SOLN
INTRAMUSCULAR | Status: DC | PRN
  Administered 2017-08-22: 4 mg via INTRAVENOUS

## 2017-08-22 MED ORDER — MIDAZOLAM HCL 5 MG/5ML IJ SOLN
INTRAMUSCULAR | Status: DC | PRN
  Administered 2017-08-22: 2 mg via INTRAVENOUS

## 2017-08-22 MED ORDER — DEXAMETHASONE SODIUM PHOSPHATE 4 MG/ML IJ SOLN
INTRAMUSCULAR | Status: DC | PRN
  Administered 2017-08-22: 10 mg via INTRAVENOUS

## 2017-08-22 MED ORDER — OXYCODONE HCL 5 MG/5ML PO SOLN: 5.0000 mg | Freq: Once | ORAL | Status: DC | PRN

## 2017-08-22 MED ORDER — CEFAZOLIN SODIUM-DEXTROSE 2-4 GM/100ML-% IV SOLN
2.0000 g | INTRAVENOUS | Status: AC
  Administered 2017-08-22: 2 g via INTRAVENOUS

## 2017-08-22 MED ORDER — OXYCODONE HCL 5 MG PO TABS: 5.0000 mg | ORAL_TABLET | Freq: Once | ORAL | Status: DC | PRN

## 2017-08-22 MED ORDER — CEFAZOLIN SODIUM-DEXTROSE 2-4 GM/100ML-% IV SOLN
INTRAVENOUS | Status: AC
  Filled 2017-08-22: qty 100

## 2017-08-22 SURGICAL SUPPLY — 45 items
APL SKNCLS STERI-STRIP NONHPOA (GAUZE/BANDAGES/DRESSINGS) ×1
BENZOIN TINCTURE PRP APPL 2/3 (GAUZE/BANDAGES/DRESSINGS) ×2 IMPLANT
BLADE CLIPPER SURG (BLADE) ×2 IMPLANT
BLADE SURG 15 STRL LF DISP TIS (BLADE) ×1 IMPLANT
BLADE SURG 15 STRL SS (BLADE) ×2
CANISTER SUCT 1200ML W/VALVE (MISCELLANEOUS) IMPLANT
CHLORAPREP W/TINT 26ML (MISCELLANEOUS) ×2 IMPLANT
COVER BACK TABLE 60X90IN (DRAPES) ×2 IMPLANT
COVER MAYO STAND STRL (DRAPES) ×2 IMPLANT
DECANTER SPIKE VIAL GLASS SM (MISCELLANEOUS) IMPLANT
DRAPE LAPAROTOMY 100X72 PEDS (DRAPES) ×2 IMPLANT
DRAPE UTILITY XL STRL (DRAPES) ×1 IMPLANT
DRSG TEGADERM 2-3/8X2-3/4 SM (GAUZE/BANDAGES/DRESSINGS) ×1 IMPLANT
DRSG TEGADERM 4X4.75 (GAUZE/BANDAGES/DRESSINGS) IMPLANT
ELECT COATED BLADE 2.86 ST (ELECTRODE) ×2 IMPLANT
ELECT REM PT RETURN 9FT ADLT (ELECTROSURGICAL) ×2
ELECTRODE REM PT RTRN 9FT ADLT (ELECTROSURGICAL) ×1 IMPLANT
GAUZE SPONGE 4X4 12PLY STRL LF (GAUZE/BANDAGES/DRESSINGS) IMPLANT
GLOVE BIO SURGEON STRL SZ 6.5 (GLOVE) ×2 IMPLANT
GLOVE BIO SURGEON STRL SZ7 (GLOVE) ×2 IMPLANT
GLOVE BIOGEL PI IND STRL 7.0 (GLOVE) IMPLANT
GLOVE BIOGEL PI IND STRL 7.5 (GLOVE) ×1 IMPLANT
GLOVE BIOGEL PI INDICATOR 7.0 (GLOVE) ×2
GLOVE BIOGEL PI INDICATOR 7.5 (GLOVE) ×1
GOWN STRL REUS W/ TWL LRG LVL3 (GOWN DISPOSABLE) ×2 IMPLANT
GOWN STRL REUS W/TWL LRG LVL3 (GOWN DISPOSABLE) ×4
NDL HYPO 25X1 1.5 SAFETY (NEEDLE) ×1 IMPLANT
NEEDLE HYPO 25X1 1.5 SAFETY (NEEDLE) ×2 IMPLANT
NS IRRIG 1000ML POUR BTL (IV SOLUTION) IMPLANT
PACK BASIN DAY SURGERY FS (CUSTOM PROCEDURE TRAY) ×2 IMPLANT
PENCIL BUTTON HOLSTER BLD 10FT (ELECTRODE) ×2 IMPLANT
SLEEVE SCD COMPRESS KNEE MED (MISCELLANEOUS) ×2 IMPLANT
SPONGE GAUZE 2X2 8PLY STRL LF (GAUZE/BANDAGES/DRESSINGS) IMPLANT
STRIP CLOSURE SKIN 1/2X4 (GAUZE/BANDAGES/DRESSINGS) ×2 IMPLANT
SUT MON AB 4-0 PC3 18 (SUTURE) IMPLANT
SUT PROLENE 6 0 P 1 18 (SUTURE) IMPLANT
SUT SILK 2 0 PERMA HAND 18 BK (SUTURE) IMPLANT
SUT VIC AB 3-0 SH 27 (SUTURE) ×2
SUT VIC AB 3-0 SH 27X BRD (SUTURE) ×1 IMPLANT
SYR BULB 3OZ (MISCELLANEOUS) ×2 IMPLANT
SYR CONTROL 10ML LL (SYRINGE) ×2 IMPLANT
TOWEL OR 17X24 6PK STRL BLUE (TOWEL DISPOSABLE) ×2 IMPLANT
TOWEL OR NON WOVEN STRL DISP B (DISPOSABLE) ×2 IMPLANT
TUBE CONNECTING 20X1/4 (TUBING) IMPLANT
YANKAUER SUCT BULB TIP NO VENT (SUCTIONS) IMPLANT

## 2017-08-22 NOTE — Anesthesia Postprocedure Evaluation (Signed)
Anesthesia Post Note  Patient: Karl Davidson  Procedure(s) Performed: EXCISION MASS RIGHT FOREARM (Right Arm Lower)     Patient location during evaluation: PACU Anesthesia Type: General Level of consciousness: sedated and patient cooperative Pain management: pain level controlled Vital Signs Assessment: post-procedure vital signs reviewed and stable Respiratory status: spontaneous breathing Cardiovascular status: stable Anesthetic complications: no    Last Vitals:  Vitals:   08/22/17 0930 08/22/17 1000  BP: 139/90 (!) 124/94  Pulse: 77 77  Resp: 16 18  Temp:  36.6 C  SpO2: 98% 98%    Last Pain:  Vitals:   08/22/17 1000  PainSc: 0-No pain                 Nolon Nations

## 2017-08-22 NOTE — Interval H&P Note (Signed)
History and Physical Interval Note:  08/22/2017 8:13 AM  Karl Davidson  has presented today for surgery, with the diagnosis of PAINFUL MASS RIGHT LATERAL FOREARM  The various methods of treatment have been discussed with the patient and family. After consideration of risks, benefits and other options for treatment, the patient has consented to  Procedure(s) with comments: EXCISION MASS RIGHT FOREARM (Right) - LMA as a surgical intervention .  The patient's history has been reviewed, patient examined, no change in status, stable for surgery.  I have reviewed the patient's chart and labs.  Questions were answered to the patient's satisfaction.     Maia Petties

## 2017-08-22 NOTE — Discharge Instructions (Signed)
[place] Office Phone Number [phone]  Mass Excision: POST OP INSTRUCTIONS  Always review your discharge instruction sheet given to you by the facility where your surgery was performed.  IF YOU HAVE DISABILITY OR FAMILY LEAVE FORMS, YOU MUST BRING THEM TO THE OFFICE FOR PROCESSING.  DO NOT GIVE THEM TO YOUR DOCTOR.  1. You may take acetaminophen (Tylenol) or ibuprofen (Advil) as needed. 2. Take your usually prescribed medications unless otherwise directed 3. You should eat very light the first 24 hours after surgery, such as soup, crackers, pudding, etc.  Resume your normal diet the day after surgery. 4. Most patients will experience some swelling and bruising around the surgical site.  Ice packs will help.  Swelling and bruising can take several days to resolve.  5. You may remove your bandages 48 hours after surgery, and you may shower at that time.  You will have steri-strips (small skin tapes) in place directly over the incision.  These strips should be left on the skin for 7-10 days.   6. ACTIVITIES:  You may resume regular daily activities (gradually increasing) beginning the next day.   You may have sexual intercourse when it is comfortable. a. You may drive when you no longer are taking prescription pain medication, you can comfortably wear a seatbelt, and you can safely maneuver your car and apply brakes. b. RETURN TO WORK:  1-2 weeks 7. You should see your doctor in the office for a follow-up appointment approximately two to three weeks after your surgery.    WHEN TO CALL YOUR DOCTOR: 1. Fever over 101.0 2. Nausea and/or vomiting. 3. Extreme swelling or bruising. 4. Continued bleeding from incision. 5. Increased pain, redness, or drainage from the incision.  The clinic staff is available to answer your questions during regular business hours.  Please dont hesitate to call and ask to speak to one of the nurses for clinical concerns.  If you have a medical  emergency, go to the nearest emergency room or call 911.  A surgeon from [facility] is always on call at the hospital.  For further questions, please visit [email or web address]    Post Anesthesia Home Care Instructions  Activity: Get plenty of rest for the remainder of the day. A responsible individual must stay with you for 24 hours following the procedure.  For the next 24 hours, DO NOT: -Drive a car -Paediatric nurse -Drink alcoholic beverages -Take any medication unless instructed by your physician -Make any legal decisions or sign important papers.  Meals: Start with liquid foods such as gelatin or soup. Progress to regular foods as tolerated. Avoid greasy, spicy, heavy foods. If nausea and/or vomiting occur, drink only clear liquids until the nausea and/or vomiting subsides. Call your physician if vomiting continues.  Special Instructions/Symptoms: Your throat may feel dry or sore from the anesthesia or the breathing tube placed in your throat during surgery. If this causes discomfort, gargle with warm salt water. The discomfort should disappear within 24 hours.  If you had a scopolamine patch placed behind your ear for the management of post- operative nausea and/or vomiting:  1. The medication in the patch is effective for 72 hours, after which it should be removed.  Wrap patch in a tissue and discard in the trash. Wash hands thoroughly with soap and water. 2. You may remove the patch earlier than 72 hours if you experience unpleasant side effects which may include dry mouth, dizziness or visual disturbances. 3. Avoid touching the patch.  Wash your hands with soap and water after contact with the patch.

## 2017-08-22 NOTE — Anesthesia Procedure Notes (Signed)
Procedure Name: LMA Insertion Date/Time: 08/22/2017 8:30 AM Performed by: Marrianne Mood, CRNA Pre-anesthesia Checklist: Patient identified, Emergency Drugs available, Suction available, Patient being monitored and Timeout performed Patient Re-evaluated:Patient Re-evaluated prior to induction Oxygen Delivery Method: Circle system utilized Preoxygenation: Pre-oxygenation with 100% oxygen Induction Type: IV induction Ventilation: Mask ventilation without difficulty LMA: LMA inserted LMA Size: 5.0 Number of attempts: 1 Airway Equipment and Method: Bite block Placement Confirmation: positive ETCO2 Tube secured with: Tape Dental Injury: Teeth and Oropharynx as per pre-operative assessment

## 2017-08-22 NOTE — Transfer of Care (Signed)
Immediate Anesthesia Transfer of Care Note  Patient: Karl Davidson  Procedure(s) Performed: EXCISION MASS RIGHT FOREARM (Right Arm Lower)  Patient Location: PACU  Anesthesia Type:General  Level of Consciousness: awake  Airway & Oxygen Therapy: Patient Spontanous Breathing and Patient connected to face mask oxygen  Post-op Assessment: Report given to RN and Post -op Vital signs reviewed and stable  Post vital signs: Reviewed and stable  Last Vitals:  Vitals:   08/22/17 0746  BP: (!) 142/94  Pulse: 66  Resp: 15  Temp: 36.6 C  SpO2: 100%    Last Pain: There were no vitals filed for this visit.       Complications: No apparent anesthesia complications

## 2017-08-22 NOTE — Op Note (Signed)
Preop diagnosis: Subcutaneous mass-right forearm (2 cm) Postop diagnosis: Same Procedure performed: Excision of subcutaneous mass right forearm Surgeon: Donnie Mesa, MD Anesthesia: General via LMA Indications:This is a 52 year old male who presents with a 5 year history of a palpable mass in the lateral right forearm. He was evaluated by Dr. Alphonsa Overall in 2014. At that time this mass measured less than 1 cm. This was confirmed on ultrasound. The patient decided not to have this area removed. He has become noticeably larger and is causing severe tenderness. The patient has trouble making through the entire day at work because of the pain in his forearm. When this area is very tender there is some pain shooting down towards his fourth and fifth fingers. He denies any infections in this area. There has not been any further imaging of this area.   Description of procedure: The patient is brought to the operating room and placed in the supine position on the operating room table with his right arm extended on an armboard.  After an adequate level of general anesthesia was obtained, his forearm was prepped with ChloraPrep and draped in sterile fashion.  A timeout was taken to ensure the proper patient and proper procedure.  We infiltrated the area over the mass with 0.25% Marcaine with epinephrine.  I made a 2 cm incision over the mass.  We dissected down to this mass.  Appears to be a fairly firm whitish lesion.  We dissected completely around the mass and removed it entirely.  Hemostasis was obtained with cautery.  We closed the wound with 3-0 Vicryl and 4-0 Monocryl.  Benzoin and Steri-Strips were applied.  The patient was then extubated and brought to the recovery room in stable condition.  All sponge, instrument, and needle counts are correct.  Imogene Burn. Georgette Dover, MD, [facility]  General/ Trauma Surgery  08/22/2017 9:03 AM

## 2017-08-22 NOTE — Anesthesia Preprocedure Evaluation (Signed)
Anesthesia Evaluation  Patient identified by MRN, date of birth, ID band Patient awake    Reviewed: Allergy & Precautions, NPO status , Patient's Chart, lab work & pertinent test results  Airway Mallampati: II  TM Distance: >3 FB Neck ROM: Full    Dental no notable dental hx.    Pulmonary neg pulmonary ROS,    Pulmonary exam normal breath sounds clear to auscultation       Cardiovascular hypertension, Pt. on medications Normal cardiovascular exam Rhythm:Regular Rate:Normal     Neuro/Psych negative neurological ROS  negative psych ROS   GI/Hepatic Neg liver ROS, GERD  ,  Endo/Other  negative endocrine ROS  Renal/GU negative Renal ROS     Musculoskeletal negative musculoskeletal ROS (+)   Abdominal   Peds  Hematology negative hematology ROS (+)   Anesthesia Other Findings   Reproductive/Obstetrics negative OB ROS                             Anesthesia Physical Anesthesia Plan  ASA: II  Anesthesia Plan: General   Post-op Pain Management:    Induction: Intravenous  PONV Risk Score and Plan: 3 and Ondansetron, Dexamethasone and Midazolam  Airway Management Planned: LMA  Additional Equipment:   Intra-op Plan:   Post-operative Plan: Extubation in OR  Informed Consent: I have reviewed the patients History and Physical, chart, labs and discussed the procedure including the risks, benefits and alternatives for the proposed anesthesia with the patient or authorized representative who has indicated his/her understanding and acceptance.   Dental advisory given  Plan Discussed with: CRNA  Anesthesia Plan Comments:         Anesthesia Quick Evaluation

## 2017-08-23 ENCOUNTER — Encounter (HOSPITAL_BASED_OUTPATIENT_CLINIC_OR_DEPARTMENT_OTHER): Payer: Self-pay | Admitting: Surgery

## 2017-11-26 DIAGNOSIS — S90129A Contusion of unspecified lesser toe(s) without damage to nail, initial encounter: Secondary | ICD-10-CM | POA: Diagnosis not present

## 2018-02-18 DIAGNOSIS — F411 Generalized anxiety disorder: Secondary | ICD-10-CM | POA: Diagnosis not present

## 2018-02-18 DIAGNOSIS — E669 Obesity, unspecified: Secondary | ICD-10-CM | POA: Diagnosis not present

## 2018-02-18 DIAGNOSIS — I10 Essential (primary) hypertension: Secondary | ICD-10-CM | POA: Diagnosis not present

## 2018-02-18 DIAGNOSIS — Z6831 Body mass index (BMI) 31.0-31.9, adult: Secondary | ICD-10-CM | POA: Diagnosis not present

## 2018-03-26 DIAGNOSIS — H5213 Myopia, bilateral: Secondary | ICD-10-CM | POA: Diagnosis not present

## 2018-04-22 DIAGNOSIS — J209 Acute bronchitis, unspecified: Secondary | ICD-10-CM | POA: Diagnosis not present

## 2018-05-15 DIAGNOSIS — J029 Acute pharyngitis, unspecified: Secondary | ICD-10-CM | POA: Diagnosis not present

## 2018-05-15 DIAGNOSIS — J392 Other diseases of pharynx: Secondary | ICD-10-CM | POA: Diagnosis not present

## 2018-06-06 DIAGNOSIS — R0602 Shortness of breath: Secondary | ICD-10-CM | POA: Diagnosis not present

## 2018-06-06 DIAGNOSIS — R05 Cough: Secondary | ICD-10-CM | POA: Diagnosis not present

## 2018-06-06 DIAGNOSIS — J209 Acute bronchitis, unspecified: Secondary | ICD-10-CM | POA: Diagnosis not present

## 2018-12-24 ENCOUNTER — Other Ambulatory Visit: Payer: Self-pay | Admitting: Family Medicine

## 2018-12-24 DIAGNOSIS — R1013 Epigastric pain: Secondary | ICD-10-CM

## 2019-01-03 ENCOUNTER — Ambulatory Visit
Admission: RE | Admit: 2019-01-03 | Discharge: 2019-01-03 | Disposition: A | Discharge status: Home / Self Care | Payer: Self-pay | Source: Ambulatory Visit | Attending: Family Medicine | Admitting: Family Medicine

## 2019-01-03 DIAGNOSIS — R1013 Epigastric pain: Secondary | ICD-10-CM

## 2020-01-30 DIAGNOSIS — Z20822 Contact with and (suspected) exposure to covid-19: Secondary | ICD-10-CM | POA: Diagnosis not present

## 2020-01-30 DIAGNOSIS — R509 Fever, unspecified: Secondary | ICD-10-CM | POA: Diagnosis not present

## 2020-01-31 DIAGNOSIS — B349 Viral infection, unspecified: Secondary | ICD-10-CM | POA: Diagnosis not present

## 2020-01-31 DIAGNOSIS — R509 Fever, unspecified: Secondary | ICD-10-CM | POA: Diagnosis not present

## 2020-05-02 IMAGING — US ULTRASOUND ABDOMEN COMPLETE
1 series · 14 of 25 positions shown · non-contrast
Comparison: None.

CLINICAL DATA: Epigastric pain

EXAM:
ABDOMEN ULTRASOUND COMPLETE

[Series 1: ultrasound abdomen complete · 0.23mm/px · 14 of 82 slices shown]
[im 1/82]
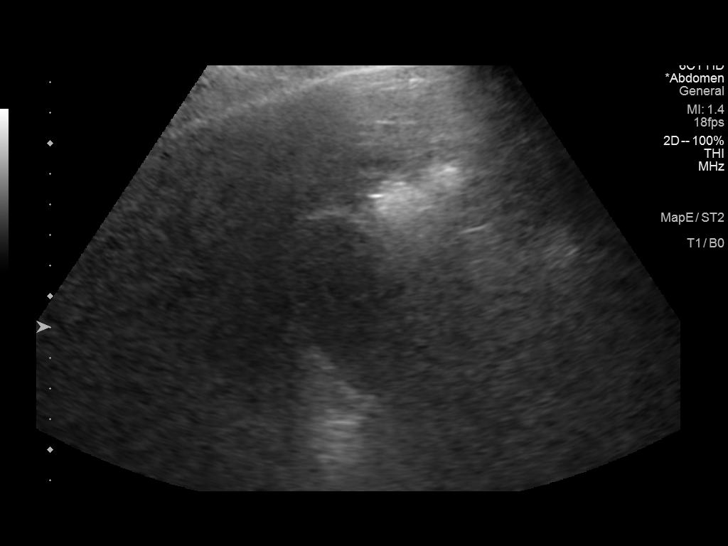
[im 7/82]
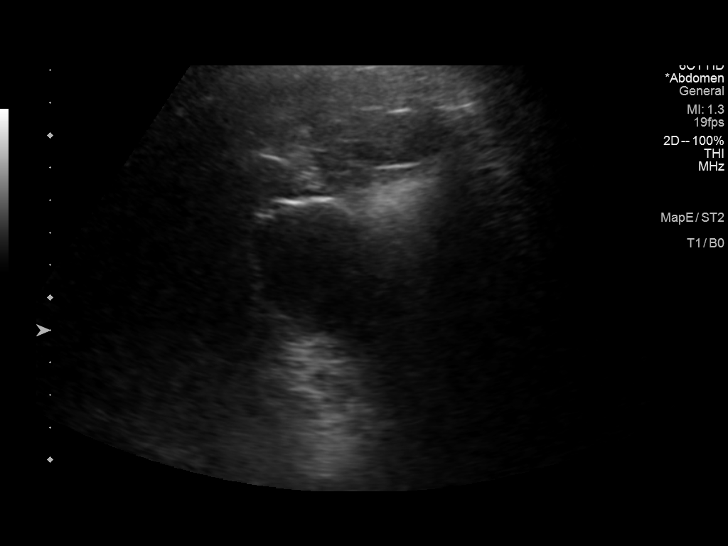
[im 14/82]
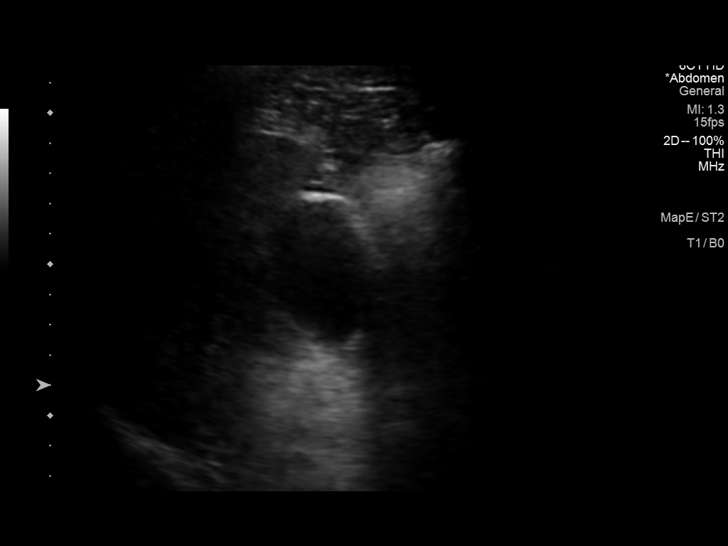
[im 21/82]
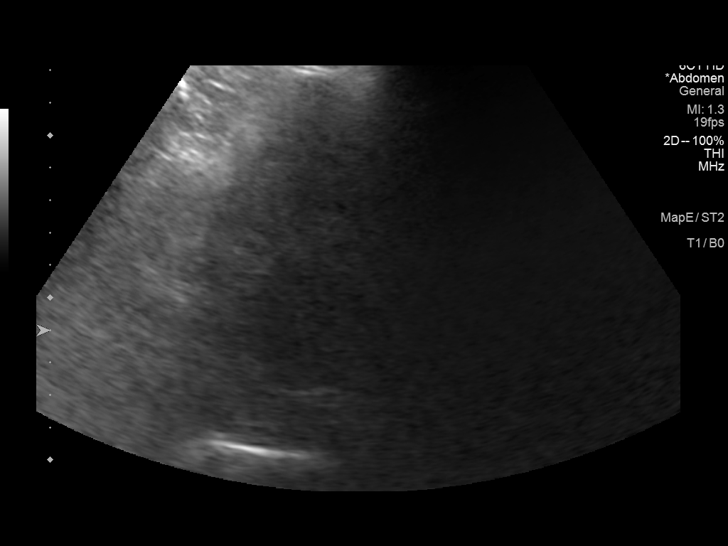
[im 28/82]
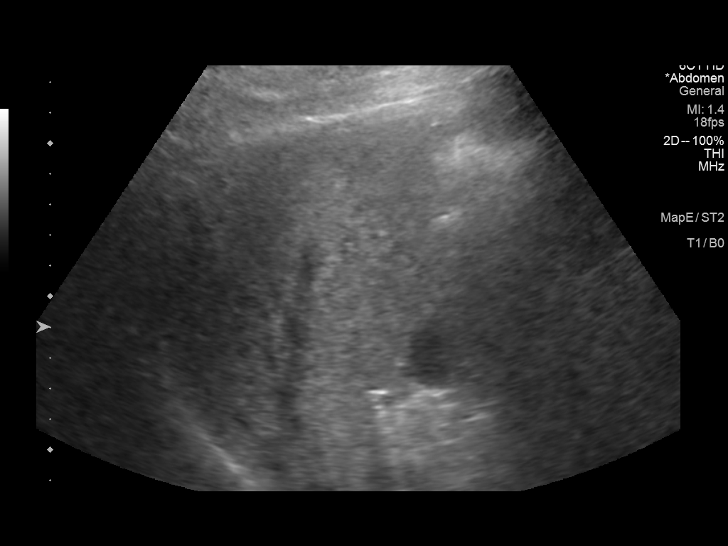
[im 31/82]
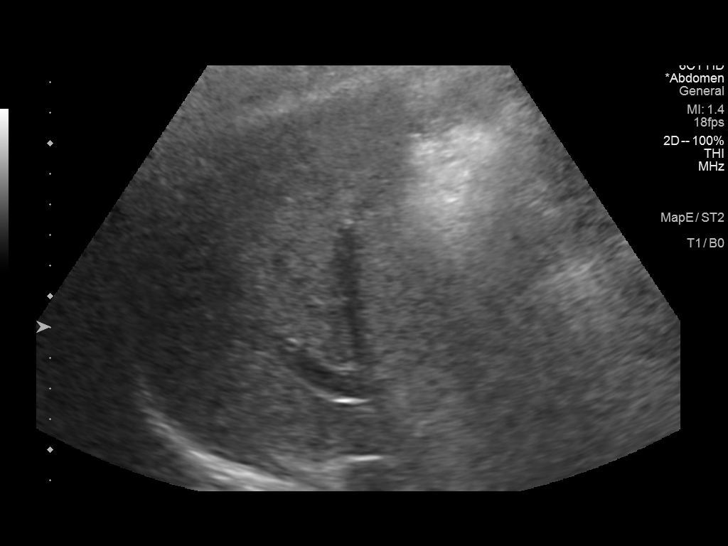
[im 38/82]
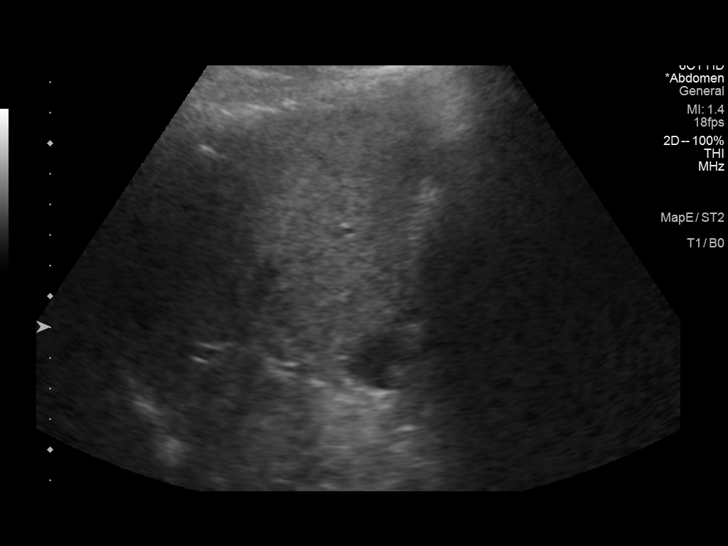
[im 44/82]
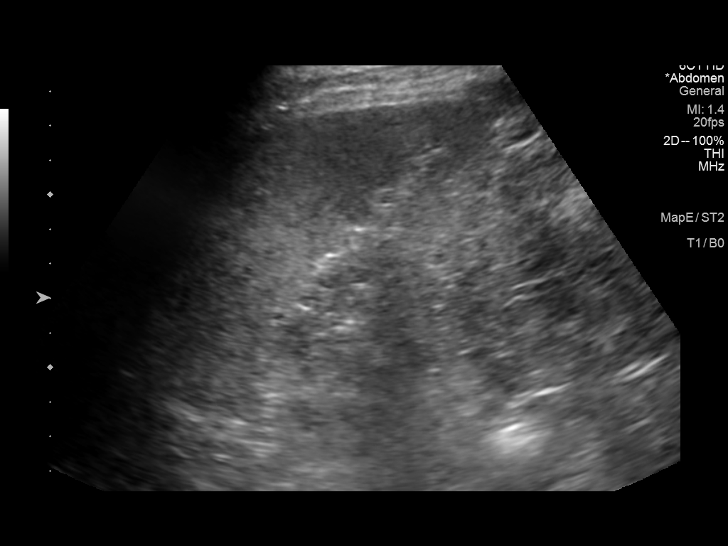
[im 51/82]
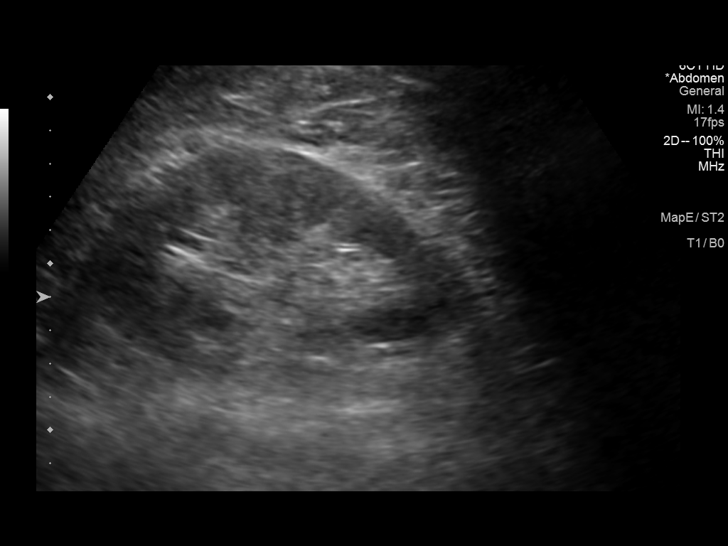
[im 55/82]
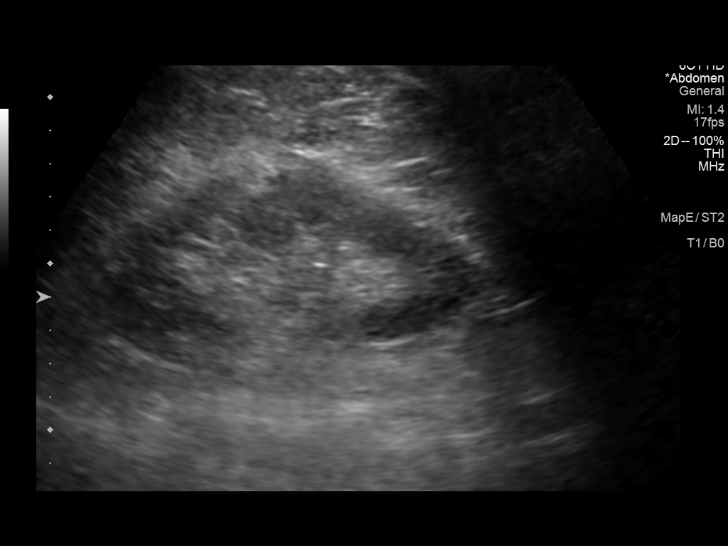
[im 61/82]
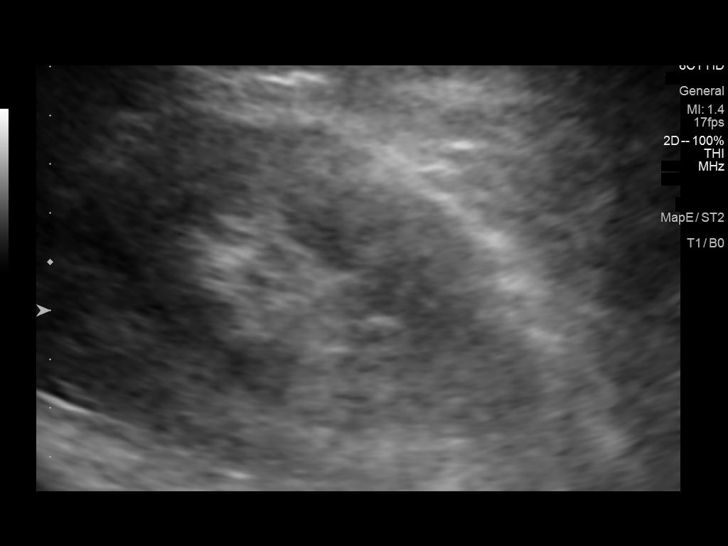
[im 68/82]
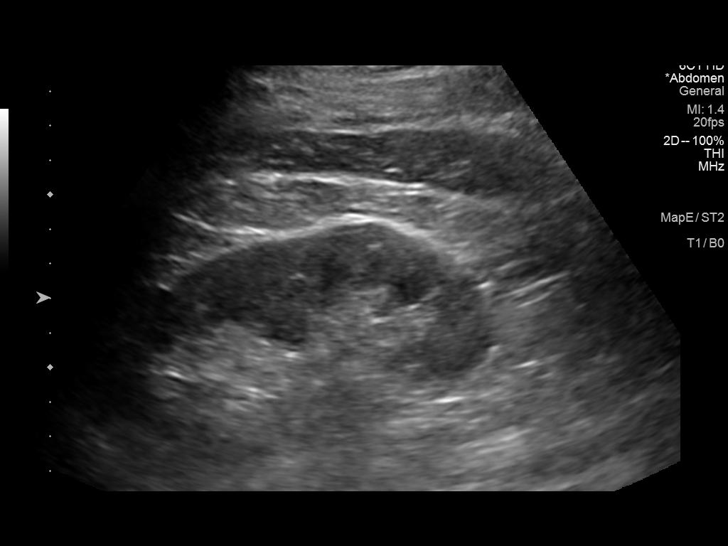
[im 75/82]
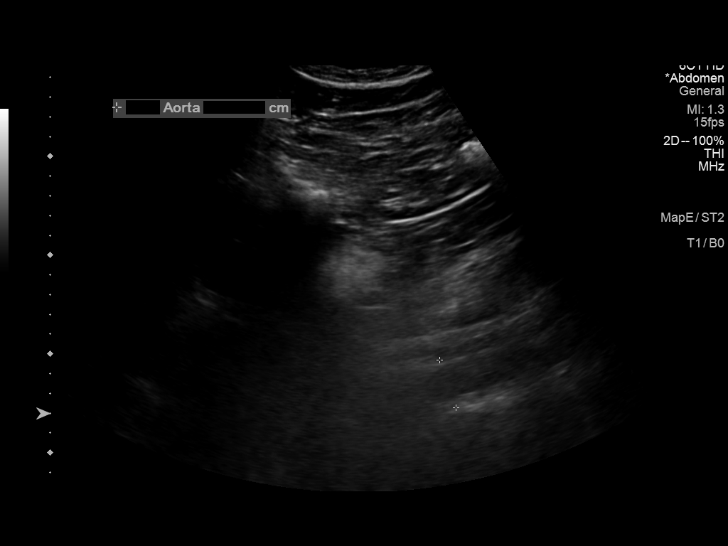
[im 82/82]
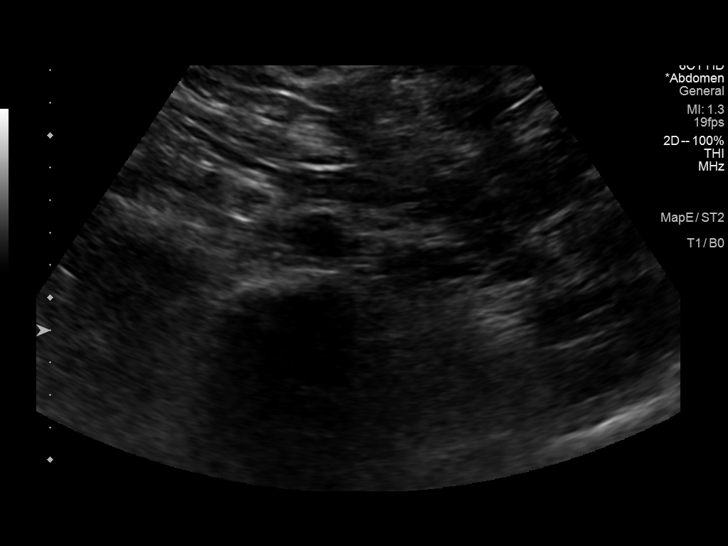

[14 of 25 positions shown; findings below may reference images not displayed]

FINDINGS: Gallbladder: No gallstones or wall thickening visualized. No
sonographic Murphy sign noted by sonographer.

Common bile duct: Diameter: 3.8 mm

Liver: Diffusely increased echogenicity liver without focal liver
lesion. Liver difficult to penetrate by ultrasound. Portal vein is
patent on color Doppler imaging with normal direction of blood flow
towards the liver.

IVC: No abnormality visualized.

Pancreas: Obscured by bowel gas

Spleen: Size and appearance within normal limits.

Right Kidney: Length: 11.9 cm. Echogenicity within normal limits. No
mass or hydronephrosis visualized.

Left Kidney: Length: 12.1 cm. Echogenicity within normal limits. No
mass or hydronephrosis visualized.

Abdominal aorta: No aneurysm visualized.

Other findings: None.
IMPRESSION: Negative for gallstones.  Fatty liver.
